# Patient Record
Sex: Male | Born: 1985
Health system: Southern US, Community
[De-identification: ages and names within clinical notes are randomized; demographics above are authoritative.]

## PROBLEM LIST (undated history)

## (undated) DIAGNOSIS — J45909 Unspecified asthma, uncomplicated: Secondary | ICD-10-CM

## (undated) HISTORY — PX: OPEN REDUCTION INTERNAL FIXATION (ORIF) HAND: SHX5991

---
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
## 2005-03-25 ENCOUNTER — Ambulatory Visit: Payer: Self-pay | Admitting: Family Medicine

## 2005-05-24 ENCOUNTER — Ambulatory Visit: Payer: Self-pay | Admitting: Family Medicine

## 2005-07-13 ENCOUNTER — Emergency Department (HOSPITAL_COMMUNITY): Admission: EM | Admit: 2005-07-13 | Discharge: 2005-07-13 | Payer: Self-pay | Admitting: *Deleted

## 2005-09-16 ENCOUNTER — Ambulatory Visit: Payer: Self-pay | Admitting: Family Medicine

## 2006-01-01 ENCOUNTER — Emergency Department (HOSPITAL_COMMUNITY): Admission: EM | Admit: 2006-01-01 | Discharge: 2006-01-01 | Payer: Self-pay | Admitting: Family Medicine

## 2006-08-12 ENCOUNTER — Ambulatory Visit: Payer: Self-pay | Admitting: Family Medicine

## 2007-01-05 ENCOUNTER — Ambulatory Visit: Payer: Self-pay | Admitting: Family Medicine

## 2007-01-08 DIAGNOSIS — J45909 Unspecified asthma, uncomplicated: Secondary | ICD-10-CM

## 2007-01-08 DIAGNOSIS — J309 Allergic rhinitis, unspecified: Secondary | ICD-10-CM | POA: Insufficient documentation

## 2007-05-29 ENCOUNTER — Emergency Department (HOSPITAL_COMMUNITY): Admission: EM | Admit: 2007-05-29 | Discharge: 2007-05-29 | Payer: Self-pay | Admitting: Emergency Medicine

## 2011-02-07 ENCOUNTER — Emergency Department (HOSPITAL_COMMUNITY)
Admission: EM | Admit: 2011-02-07 | Discharge: 2011-02-08 | Disposition: A | Discharge status: Home / Self Care | Payer: Federal, State, Local not specified - PPO | Attending: Emergency Medicine | Admitting: Emergency Medicine

## 2011-02-07 DIAGNOSIS — J45909 Unspecified asthma, uncomplicated: Secondary | ICD-10-CM | POA: Insufficient documentation

## 2011-02-07 DIAGNOSIS — R0789 Other chest pain: Secondary | ICD-10-CM | POA: Insufficient documentation

## 2011-02-07 DIAGNOSIS — R059 Cough, unspecified: Secondary | ICD-10-CM | POA: Insufficient documentation

## 2011-02-07 DIAGNOSIS — R05 Cough: Secondary | ICD-10-CM | POA: Insufficient documentation

## 2011-07-04 ENCOUNTER — Emergency Department: Payer: Self-pay | Admitting: Emergency Medicine

## 2012-05-10 ENCOUNTER — Emergency Department: Payer: Self-pay | Admitting: *Deleted

## 2012-07-22 ENCOUNTER — Ambulatory Visit: Payer: Self-pay | Admitting: Family Medicine

## 2015-12-28 ENCOUNTER — Other Ambulatory Visit: Payer: Self-pay | Admitting: Internal Medicine

## 2016-01-08 ENCOUNTER — Emergency Department
Admission: EM | Admit: 2016-01-08 | Discharge: 2016-01-08 | Disposition: A | Discharge status: Home / Self Care | Payer: Federal, State, Local not specified - PPO | Attending: Emergency Medicine | Admitting: Emergency Medicine

## 2016-01-08 ENCOUNTER — Encounter: Payer: Self-pay | Admitting: *Deleted

## 2016-01-08 DIAGNOSIS — Z7951 Long term (current) use of inhaled steroids: Secondary | ICD-10-CM | POA: Diagnosis not present

## 2016-01-08 DIAGNOSIS — J45909 Unspecified asthma, uncomplicated: Secondary | ICD-10-CM | POA: Diagnosis not present

## 2016-01-08 DIAGNOSIS — J069 Acute upper respiratory infection, unspecified: Secondary | ICD-10-CM | POA: Diagnosis not present

## 2016-01-08 DIAGNOSIS — F17200 Nicotine dependence, unspecified, uncomplicated: Secondary | ICD-10-CM | POA: Insufficient documentation

## 2016-01-08 DIAGNOSIS — R0981 Nasal congestion: Secondary | ICD-10-CM | POA: Diagnosis present

## 2016-01-08 MED ORDER — PSEUDOEPHEDRINE HCL 60 MG PO TABS: 60.0000 mg | ORAL_TABLET | ORAL | Status: DC | PRN

## 2016-01-08 MED ORDER — CHLORPHENIRAMINE MALEATE 4 MG PO TABS: 4.0000 mg | ORAL_TABLET | Freq: Two times a day (BID) | ORAL | Status: DC | PRN

## 2016-01-08 NOTE — ED Provider Notes (Signed)
[facility] Emergency Department Provider Note  ____________________________________________  Time seen: Approximately 9:16 PM  I have reviewed the triage vital signs and the nursing notes.   HISTORY  Chief Complaint Nasal Congestion    HPI Cody Murray. is a 30 y.o. male presents for evaluation of a 2- 3 day history of nasal congestion denies any cough denies any fever chills or sore throat. Has been taking over-the-counter medications without relief. Nothing seems to make it better or worse.   No past medical history on file.  Patient Active Problem List   Diagnosis Date Noted  . RHINITIS, ALLERGIC 01/08/2007  . ASTHMA, UNSPECIFIED 01/08/2007    No past surgical history on file.  Current Outpatient Rx  Name  Route  Sig  Dispense  Refill  . ADVAIR DISKUS 250-50 MCG/DOSE AEPB      INHALE 1 DOSE BY MOUTH TWICE DAILY. RINSE MOUTH AFTER USE   60 each   0   . chlorpheniramine (CHLOR-TRIMETON) 4 MG tablet   Oral   Take 1 tablet (4 mg total) by mouth 2 (two) times daily as needed for allergies or rhinitis.   30 tablet   0   . pseudoephedrine (SUDAFED) 60 MG tablet   Oral   Take 1 tablet (60 mg total) by mouth every 4 (four) hours as needed for congestion.   24 tablet   0   . VENTOLIN HFA 108 (90 Base) MCG/ACT inhaler      INHALE 2 PUFFS 4 TIMES DAILY AS NEEDED   18 Inhaler   0     Allergies Review of patient's allergies indicates no known allergies.  No family history on file.  Social History Social History  Substance Use Topics  . Smoking status: Current Some Day Smoker  . Smokeless tobacco: Not on file  . Alcohol Use: Yes    Review of Systems Constitutional: No fever/chills Eyes: No visual changes. ENT: No sore throat. Positive runny nose and nasal congestion. Cardiovascular: Denies chest pain. Respiratory: Denies shortness of breath. Denies any cough Gastrointestinal: No abdominal pain.  No nausea, no vomiting.   No diarrhea.  No constipation. Genitourinary: Negative for dysuria. Musculoskeletal: Negative for back pain. Skin: Negative for rash. Neurological: Negative for headaches, focal weakness or numbness.  10-point ROS otherwise negative.  ____________________________________________   PHYSICAL EXAM:  VITAL SIGNS: ED Triage Vitals  Enc Vitals Group     BP 01/08/16 2012 133/80 mmHg     Pulse Rate 01/08/16 2012 77     Resp 01/08/16 2012 20     Temp 01/08/16 2012 97.7 F (36.5 C)     Temp Source 01/08/16 2012 Oral     SpO2 01/08/16 2012 99 %     Weight 01/08/16 2012 208 lb (94.348 kg)     Height 01/08/16 2012 6' (1.829 m)     Head Cir --      Peak Flow --      Pain Score --      Pain Loc --      Pain Edu? --      Excl. in GC? --     Constitutional: Alert and oriented. Well appearing and in no acute distress. Head: Atraumatic. Nose: Positive congestion/rhinnorhea, turbinates swollen bilaterally. Mouth/Throat: Mucous membranes are moist.  Oropharynx non-erythematous. Neck: No stridor.  No cervical adenopathy. Cardiovascular: Normal rate, regular rhythm. Grossly normal heart sounds.  Good peripheral circulation. Respiratory: Normal respiratory effort.  No retractions. Lungs CTAB. Skin:  Skin is warm,  dry and intact. No rash noted. Psychiatric: Mood and affect are normal. Speech and behavior are normal.  ____________________________________________   LABS (all labs ordered are listed, but only abnormal results are displayed)  Labs Reviewed - No data to display ____________________________________________    PROCEDURES  Procedure(s) performed: None  Critical Care performed: No  ____________________________________________   INITIAL IMPRESSION / ASSESSMENT AND PLAN / ED COURSE  Pertinent labs & imaging results that were available during my care of the patient were reviewed by me and considered in my medical decision making (see chart for details).  Acute viral  URI. Rx given for Sudafed and chlorpheniramine. Patient to follow up with PCP or return to the ER with any worsening symptomology. Work excuse 24 hours given patient to follow up with PCP. ____________________________________________   FINAL CLINICAL IMPRESSION(S) / ED DIAGNOSES  Final diagnoses:  URI, acute     This chart was dictated using voice recognition software/Dragon. Despite best efforts to proofread, errors can occur which can change the meaning. Any change was purely unintentional.   Evangeline Dakin, PA-C 01/08/16 2122  Emily Filbert, MD 01/08/16 2219

## 2016-01-08 NOTE — ED Notes (Signed)
Pt reports nasal congestion for 2-3 days.  Taking otc meds without relief.

## 2016-01-08 NOTE — Discharge Instructions (Signed)
Upper Respiratory Infection, Adult Most upper respiratory infections (URIs) are a viral infection of the air passages leading to the lungs. A URI affects the nose, throat, and upper air passages. The most common type of URI is nasopharyngitis and is typically referred to as "the common cold." URIs run their course and usually go away on their own. Most of the time, a URI does not require medical attention, but sometimes a bacterial infection in the upper airways can follow a viral infection. This is called a secondary infection. Sinus and middle ear infections are common types of secondary upper respiratory infections. Bacterial pneumonia can also complicate a URI. A URI can worsen asthma and chronic obstructive pulmonary disease (COPD). Sometimes, these complications can require emergency medical care and may be life threatening.  CAUSES Almost all URIs are caused by viruses. A virus is a type of germ and can spread from one person to another.  RISKS FACTORS You may be at risk for a URI if:   You smoke.   You have chronic heart or lung disease.  You have a weakened defense (immune) system.   You are very young or very old.   You have nasal allergies or asthma.  You work in crowded or poorly ventilated areas.  You work in health care facilities or schools. SIGNS AND SYMPTOMS  Symptoms typically develop 2-3 days after you come in contact with a cold virus. Most viral URIs last 7-10 days. However, viral URIs from the influenza virus (flu virus) can last 14-18 days and are typically more severe. Symptoms may include:   Runny or stuffy (congested) nose.   Sneezing.   Cough.   Sore throat.   Headache.   Fatigue.   Fever.   Loss of appetite.   Pain in your forehead, behind your eyes, and over your cheekbones (sinus pain).  Muscle aches.  DIAGNOSIS  Your health care provider may diagnose a URI by:  Physical exam.  Tests to check that your symptoms are not due to  another condition such as:  Strep throat.  Sinusitis.  Pneumonia.  Asthma. TREATMENT  A URI goes away on its own with time. It cannot be cured with medicines, but medicines may be prescribed or recommended to relieve symptoms. Medicines may help:  Reduce your fever.  Reduce your cough.  Relieve nasal congestion. HOME CARE INSTRUCTIONS   Take medicines only as directed by your health care provider.   Gargle warm saltwater or take cough drops to comfort your throat as directed by your health care provider.  Use a warm mist humidifier or inhale steam from a shower to increase air moisture. This may make it easier to breathe.  Drink enough fluid to keep your urine clear or pale yellow.   Eat soups and other clear broths and maintain good nutrition.   Rest as needed.   Return to work when your temperature has returned to normal or as your health care provider advises. You may need to stay home longer to avoid infecting others. You can also use a face mask and careful hand washing to prevent spread of the virus.  Increase the usage of your inhaler if you have asthma.   Do not use any tobacco products, including cigarettes, chewing tobacco, or electronic cigarettes. If you need help quitting, ask your health care provider. PREVENTION  The best way to protect yourself from getting a cold is to practice good hygiene.   Avoid oral or hand contact with people with cold   symptoms.   Wash your hands often if contact occurs.  There is no clear evidence that vitamin C, vitamin E, echinacea, or exercise reduces the chance of developing a cold. However, it is always recommended to get plenty of rest, exercise, and practice good nutrition.  SEEK MEDICAL CARE IF:   You are getting worse rather than better.   Your symptoms are not controlled by medicine.   You have chills.  You have worsening shortness of breath.  You have brown or red mucus.  You have yellow or brown nasal  discharge.  You have pain in your face, especially when you bend forward.  You have a fever.  You have swollen neck glands.  You have pain while swallowing.  You have white areas in the back of your throat. SEEK IMMEDIATE MEDICAL CARE IF:   You have severe or persistent:  Headache.  Ear pain.  Sinus pain.  Chest pain.  You have chronic lung disease and any of the following:  Wheezing.  Prolonged cough.  Coughing up blood.  A change in your usual mucus.  You have a stiff neck.  You have changes in your:  Vision.  Hearing.  Thinking.  Mood. MAKE SURE YOU:   Understand these instructions.  Will watch your condition.  Will get help right away if you are not doing well or get worse.   This information is not intended to replace advice given to you by your health care provider. Make sure you discuss any questions you have with your health care provider.   Document Released: 04/23/2001 Document Revised: 03/14/2015 Document Reviewed: 02/02/2014 Elsevier Interactive Patient Education 2016 [employer]vier Inc.  

## 2016-01-31 ENCOUNTER — Other Ambulatory Visit: Payer: Self-pay | Admitting: Internal Medicine

## 2016-02-08 NOTE — Telephone Encounter (Signed)
Lm messages for pt, and no return calls.

## 2016-04-01 ENCOUNTER — Encounter (HOSPITAL_COMMUNITY): Payer: Self-pay | Admitting: Emergency Medicine

## 2016-04-01 DIAGNOSIS — J45901 Unspecified asthma with (acute) exacerbation: Secondary | ICD-10-CM | POA: Insufficient documentation

## 2016-04-01 DIAGNOSIS — R062 Wheezing: Secondary | ICD-10-CM | POA: Diagnosis present

## 2016-04-01 MED ORDER — ALBUTEROL SULFATE (2.5 MG/3ML) 0.083% IN NEBU
5.0000 mg | INHALATION_SOLUTION | Freq: Once | RESPIRATORY_TRACT | Status: AC
  Administered 2016-04-01: 5 mg via RESPIRATORY_TRACT

## 2016-04-01 MED ORDER — ALBUTEROL SULFATE (2.5 MG/3ML) 0.083% IN NEBU
INHALATION_SOLUTION | RESPIRATORY_TRACT | Status: AC
  Filled 2016-04-01: qty 6

## 2016-04-01 NOTE — ED Notes (Signed)
Patient arrives with asthma exacerbation. States onset 2 days ago while he was in [place]. Endorses history of asthma which requires him to use inhalers daily. Recently has been out of his medications. Wheezing both ways and severely diminished in triage.

## 2016-04-02 ENCOUNTER — Other Ambulatory Visit: Payer: Self-pay | Admitting: Internal Medicine

## 2016-04-02 ENCOUNTER — Emergency Department (HOSPITAL_COMMUNITY)
Admission: EM | Admit: 2016-04-02 | Discharge: 2016-04-02 | Disposition: A | Discharge status: Home / Self Care | Payer: Federal, State, Local not specified - PPO | Attending: Emergency Medicine | Admitting: Emergency Medicine

## 2016-04-02 ENCOUNTER — Telehealth: Payer: Self-pay

## 2016-04-02 HISTORY — DX: Unspecified asthma, uncomplicated: J45.909

## 2016-04-02 NOTE — Telephone Encounter (Signed)
He has not been seen since 2014.  He needs to be seen for prescription refills.

## 2016-04-02 NOTE — Telephone Encounter (Signed)
Left message that he has been in ER twice and need Ventolin and Advair but I do not see where you have seen him.

## 2016-04-03 ENCOUNTER — Encounter: Payer: Self-pay | Admitting: Internal Medicine

## 2016-04-03 ENCOUNTER — Ambulatory Visit (INDEPENDENT_AMBULATORY_CARE_PROVIDER_SITE_OTHER): Payer: Federal, State, Local not specified - PPO | Admitting: Internal Medicine

## 2016-04-03 VITALS — BP 120/90 | HR 82 | Ht 72.0 in | Wt 190.0 lb

## 2016-04-03 DIAGNOSIS — J4520 Mild intermittent asthma, uncomplicated: Secondary | ICD-10-CM | POA: Diagnosis not present

## 2016-04-03 MED ORDER — ALBUTEROL SULFATE HFA 108 (90 BASE) MCG/ACT IN AERS: 2.0000 | INHALATION_SPRAY | Freq: Four times a day (QID) | RESPIRATORY_TRACT | Status: DC | PRN

## 2016-04-03 MED ORDER — FLUTICASONE-SALMETEROL 250-50 MCG/DOSE IN AEPB: 1.0000 | INHALATION_SPRAY | Freq: Two times a day (BID) | RESPIRATORY_TRACT | Status: DC

## 2016-04-03 NOTE — Progress Notes (Signed)
Date:  04/03/2016   Name:  Cody Murray.   DOB:  August 26, 1986   MRN:  [id number]  Patient has not been seen in 2.5 years.  He has been getting medications from urgent care.  Chief Complaint: Asthma Asthma He complains of wheezing. There is no cough or shortness of breath. The problem occurs intermittently. The problem has been waxing and waning. Pertinent negatives include no chest pain, ear pain, fever, headaches, myalgias, postnasal drip, sore throat or trouble swallowing. His symptoms are aggravated by occupational exposure (now working in an area with more dust). His symptoms are alleviated by beta-agonist and steroid inhaler. He reports complete improvement on treatment. His past medical history is significant for asthma.      Review of Systems  Constitutional: Negative for fever, fatigue and unexpected weight change.  HENT: Negative for ear pain, hearing loss, postnasal drip, sore throat and trouble swallowing.   Eyes: Negative for visual disturbance.  Respiratory: Positive for wheezing. Negative for cough, chest tightness and shortness of breath.   Cardiovascular: Negative for chest pain and palpitations.  Musculoskeletal: Negative for myalgias.  Neurological: Negative for dizziness and headaches.  Psychiatric/Behavioral: Negative for sleep disturbance.    Patient Active Problem List   Diagnosis Date Noted  . RHINITIS, ALLERGIC 01/08/2007  . ASTHMA, UNSPECIFIED 01/08/2007    Prior to Admission medications   Medication Sig Start Date End Date Taking? Authorizing Provider  ADVAIR DISKUS 250-50 MCG/DOSE AEPB INHALE 1 DOSE BY MOUTH TWICE DAILY. RINSE MOUTH AFTER USE 12/28/15  Yes Reubin Milan, MD  chlorpheniramine (CHLOR-TRIMETON) 4 MG tablet Take 1 tablet (4 mg total) by mouth 2 (two) times daily as needed for allergies or rhinitis. 01/08/16  Yes Charmayne Sheer Beers, PA-C  pseudoephedrine (SUDAFED) 60 MG tablet Take 1 tablet (60 mg total) by mouth every 4 (four) hours as  needed for congestion. 01/08/16  Yes Charles M Beers, PA-C  VENTOLIN HFA 108 (90 Base) MCG/ACT inhaler INHALE 2 PUFFS 4 TIMES DAILY AS NEEDED 12/28/15  Yes Reubin Milan, MD    No Known Allergies  History reviewed. No pertinent past surgical history.  Social History  Substance Use Topics  . Smoking status: Former Games developer  . Smokeless tobacco: None  . Alcohol Use: Yes     Medication list has been reviewed and updated.   Physical Exam  Constitutional: He is oriented to person, place, and time. He appears well-developed. No distress.  HENT:  Head: Normocephalic and atraumatic.  Neck: Normal range of motion. Neck supple. No thyromegaly present.  Cardiovascular: Normal rate, regular rhythm and normal heart sounds.   Pulmonary/Chest: Effort normal. No accessory muscle usage. No respiratory distress. He has wheezes in the right lower field.  Musculoskeletal: He exhibits no edema or tenderness.  Lymphadenopathy:    He has no cervical adenopathy.  Neurological: He is alert and oriented to person, place, and time.  Skin: Skin is warm and dry. No rash noted.  Psychiatric: He has a normal mood and affect. His behavior is normal. Thought content normal.  Nursing note and vitals reviewed.   BP 120/90 mmHg  Pulse 82  Ht 6' (1.829 m)  Wt 190 lb (86.183 kg)  BMI 25.76 kg/m2  Assessment and Plan: 1. Asthma, mild intermittent, uncomplicated Continue advair bid and albuterol MDI prn - Fluticasone-Salmeterol (ADVAIR DISKUS) 250-50 MCG/DOSE AEPB; Inhale 1 puff into the lungs 2 (two) times daily.  Dispense: 60 each; Refill: 12 - albuterol (VENTOLIN HFA) 108 (90 Base) MCG/ACT  inhaler; Inhale 2 puffs into the lungs every 6 (six) hours as needed for wheezing or shortness of breath.  Dispense: 18 Inhaler; Refill: 12   Bari EdwardLaura Ranessa Kosta, MD [facility]  04/03/2016

## 2016-06-04 ENCOUNTER — Ambulatory Visit (INDEPENDENT_AMBULATORY_CARE_PROVIDER_SITE_OTHER): Payer: Federal, State, Local not specified - PPO | Admitting: Internal Medicine

## 2016-06-04 ENCOUNTER — Encounter (INDEPENDENT_AMBULATORY_CARE_PROVIDER_SITE_OTHER): Payer: Self-pay

## 2016-06-04 ENCOUNTER — Encounter: Payer: Self-pay | Admitting: Internal Medicine

## 2016-06-04 VITALS — BP 128/86 | HR 80 | Temp 98.8°F | Resp 16 | Ht 72.0 in | Wt 196.0 lb

## 2016-06-04 DIAGNOSIS — J4520 Mild intermittent asthma, uncomplicated: Secondary | ICD-10-CM | POA: Diagnosis not present

## 2016-06-04 DIAGNOSIS — B353 Tinea pedis: Secondary | ICD-10-CM | POA: Diagnosis not present

## 2016-06-04 MED ORDER — NYSTATIN 100000 UNIT/GM EX CREA: 1.0000 "application " | TOPICAL_CREAM | Freq: Two times a day (BID) | CUTANEOUS | 3 refills | Status: DC

## 2016-06-04 MED ORDER — FLUCONAZOLE 100 MG PO TABS: 100.0000 mg | ORAL_TABLET | Freq: Every day | ORAL | 0 refills | Status: DC

## 2016-06-04 NOTE — Patient Instructions (Signed)

## 2016-06-04 NOTE — Progress Notes (Signed)
    Date:  06/04/2016   Name:  Cody Murray.   DOB:  04-22-1986   MRN:  [id number]   Chief Complaint: Nail Problem (Right foot, 5th digit fungus like infection x 2-3 months denies any injury or athletes feet in past but does wear [employer] when working long hours and states his feet sweat often. )   Review of Systems  Respiratory: Negative for shortness of breath and wheezing.   Cardiovascular: Negative for chest pain.  Skin: Positive for color change and rash.    Patient Active Problem List   Diagnosis Date Noted  . RHINITIS, ALLERGIC 01/08/2007  . Asthma 01/08/2007    Prior to Admission medications   Medication Sig Start Date End Date Taking? Authorizing Provider  albuterol (VENTOLIN HFA) 108 (90 Base) MCG/ACT inhaler Inhale 2 puffs into the lungs every 6 (six) hours as needed for wheezing or shortness of breath. 04/03/16  Yes Reubin Milan, MD  Fluticasone-Salmeterol (ADVAIR DISKUS) 250-50 MCG/DOSE AEPB Inhale 1 puff into the lungs 2 (two) times daily. 04/03/16  Yes Reubin Milan, MD    No Known Allergies  History reviewed. No pertinent surgical history.  Social History  Substance Use Topics  . Smoking status: Former Games developer  . Smokeless tobacco: Not on file  . Alcohol use Yes     Medication list has been reviewed and updated.   Physical Exam  Constitutional: He is oriented to person, place, and time. He appears well-developed. No distress.  HENT:  Head: Normocephalic and atraumatic.  Cardiovascular: Normal rate, regular rhythm and intact distal pulses.   Pulmonary/Chest: Effort normal. No respiratory distress.  Musculoskeletal: Normal range of motion.  Neurological: He is alert and oriented to person, place, and time.  Skin: Skin is warm and dry. No rash noted.  Maceration between 4th and 5th toes on right foot.  Scaling of sole of foot c/w tinea.  Psychiatric: He has a normal mood and affect. His behavior is normal. Thought content normal.  Nursing note  and vitals reviewed.   BP 128/86 (BP Location: Right Arm, Patient Position: Sitting, Cuff Size: Normal)   Pulse 80   Temp 98.8 F (37.1 C) (Oral)   Resp 16   Ht 6' (1.829 m)   Wt 196 lb (88.9 kg)   SpO2 97%   BMI 26.58 kg/m   Assessment and Plan: 1. Tinea pedis of right foot Aggressive foot care discussed - fluconazole (DIFLUCAN) 100 MG tablet; Take 1 tablet (100 mg total) by mouth daily.  Dispense: 7 tablet; Refill: 0 - nystatin cream (MYCOSTATIN); Apply 1 application topically 2 (two) times daily.  Dispense: 30 g; Refill: 3  2. Asthma, mild intermittent, uncomplicated Improved - continue Advair   Bari Edward, MD [facility]  06/04/2016

## 2016-10-15 ENCOUNTER — Encounter: Payer: Self-pay | Admitting: Internal Medicine

## 2016-10-15 ENCOUNTER — Ambulatory Visit (INDEPENDENT_AMBULATORY_CARE_PROVIDER_SITE_OTHER): Payer: Federal, State, Local not specified - PPO | Admitting: Internal Medicine

## 2016-10-15 VITALS — BP 132/84 | HR 88 | Temp 98.8°F | Resp 16 | Ht 72.0 in | Wt 199.0 lb

## 2016-10-15 DIAGNOSIS — L853 Xerosis cutis: Secondary | ICD-10-CM | POA: Diagnosis not present

## 2016-10-15 DIAGNOSIS — J029 Acute pharyngitis, unspecified: Secondary | ICD-10-CM | POA: Diagnosis not present

## 2016-10-15 MED ORDER — AMOXICILLIN 500 MG PO CAPS: 500.0000 mg | ORAL_CAPSULE | Freq: Three times a day (TID) | ORAL | 0 refills | Status: DC

## 2016-10-15 NOTE — Patient Instructions (Addendum)
Ibuprofen 200 mg tablets - take 3 tablets three times a day until throat improved.

## 2016-10-15 NOTE — Progress Notes (Signed)
Date:  10/15/2016   Name:  Cody Caraslbert Puccinelli Jr.   DOB:  [birth year]   MRN:  [id number]   Chief Complaint: Sore Throat (swollen for few days can not eat for 2 days ) and Rash (dry skin patches) Sore Throat   This is a new problem. The current episode started in the past 7 days. The problem has been unchanged. Neither side of throat is experiencing more pain than the other. There has been no fever. Associated symptoms include ear pain and trouble swallowing. Pertinent negatives include no abdominal pain, coughing, diarrhea or vomiting.    Review of Systems  Constitutional: Positive for fatigue. Negative for chills and fever.  HENT: Positive for ear pain, sore throat and trouble swallowing.   Eyes: Negative for visual disturbance.  Respiratory: Negative for cough, chest tightness and wheezing.   Cardiovascular: Negative for chest pain.  Gastrointestinal: Negative for abdominal pain, diarrhea and vomiting.  Genitourinary: Negative for dysuria.  Skin:       Dry skin on arms and legs    Patient Active Problem List   Diagnosis Date Noted  . Tinea pedis of right foot 06/04/2016  . RHINITIS, ALLERGIC 01/08/2007  . Asthma 01/08/2007    Prior to Admission medications   Medication Sig Start Date End Date Taking? Authorizing Provider  albuterol (VENTOLIN HFA) 108 (90 Base) MCG/ACT inhaler Inhale 2 puffs into the lungs every 6 (six) hours as needed for wheezing or shortness of breath. 04/03/16  Yes Reubin MilanLaura H Sitara Cashwell, MD  Fluticasone-Salmeterol (ADVAIR DISKUS) 250-50 MCG/DOSE AEPB Inhale 1 puff into the lungs 2 (two) times daily. 04/03/16  Yes Reubin MilanLaura H Vang Kraeger, MD    No Known Allergies  History reviewed. No pertinent surgical history.  Social History  Substance Use Topics  . Smoking status: Former Games developermoker  . Smokeless tobacco: Never Used  . Alcohol use Yes     Medication list has been reviewed and updated.   Physical Exam  Constitutional: He is oriented to person, place, and time.  He appears well-developed. No distress.  HENT:  Head: Normocephalic and atraumatic.  Right Ear: Ear canal normal. Tympanic membrane is erythematous and retracted.  Left Ear: Ear canal normal. Tympanic membrane is erythematous and retracted.  Nose: Right sinus exhibits maxillary sinus tenderness. Left sinus exhibits maxillary sinus tenderness.  Mouth/Throat: Posterior oropharyngeal edema and posterior oropharyngeal erythema present. No oropharyngeal exudate or tonsillar abscesses.  Cardiovascular: Normal rate, regular rhythm and normal heart sounds.   Pulmonary/Chest: Effort normal and breath sounds normal. No respiratory distress.  Musculoskeletal: Normal range of motion.  Neurological: He is alert and oriented to person, place, and time.  Skin: Skin is warm and dry. No rash noted.  Dry flaking skin patches on arms - unremarkable  Psychiatric: He has a normal mood and affect. His behavior is normal. Thought content normal.  Nursing note and vitals reviewed.   BP 132/84   Pulse 88   Temp 98.8 F (37.1 C)   Resp 16   Ht 6' (1.829 m)   Wt 199 lb (90.3 kg)   SpO2 98%   BMI 26.99 kg/m   Assessment and Plan: 1. Pharyngitis, unspecified etiology Take advil 600 mg tid Warm liquids Note to be out of work until 10/20/16 - amoxicillin (AMOXIL) 500 MG capsule; Take 1 capsule (500 mg total) by mouth 3 (three) times daily.  Dispense: 30 capsule; Refill: 0  2. Xerosis of skin Use moisturizing lotion PRN   Bari EdwardLaura Nichele Slawson, MD [facility]  [facility]  10/15/2016

## 2017-02-18 ENCOUNTER — Other Ambulatory Visit: Payer: Self-pay

## 2017-02-19 MED ORDER — ALBUTEROL SULFATE HFA 108 (90 BASE) MCG/ACT IN AERS: 2.0000 | INHALATION_SPRAY | Freq: Four times a day (QID) | RESPIRATORY_TRACT | 5 refills | Status: DC | PRN

## 2017-02-26 ENCOUNTER — Other Ambulatory Visit: Payer: Self-pay | Admitting: Internal Medicine

## 2017-02-26 MED ORDER — FLUTICASONE-SALMETEROL 250-50 MCG/DOSE IN AEPB: 1.0000 | INHALATION_SPRAY | Freq: Two times a day (BID) | RESPIRATORY_TRACT | 1 refills | Status: DC

## 2017-04-29 DIAGNOSIS — R1013 Epigastric pain: Secondary | ICD-10-CM | POA: Diagnosis not present

## 2017-04-29 DIAGNOSIS — R51 Headache: Secondary | ICD-10-CM | POA: Diagnosis not present

## 2017-04-29 DIAGNOSIS — F17200 Nicotine dependence, unspecified, uncomplicated: Secondary | ICD-10-CM | POA: Diagnosis not present

## 2017-08-01 ENCOUNTER — Encounter (HOSPITAL_COMMUNITY): Payer: Self-pay | Admitting: *Deleted

## 2017-08-01 ENCOUNTER — Ambulatory Visit (HOSPITAL_COMMUNITY)
Admission: EM | Admit: 2017-08-01 | Discharge: 2017-08-01 | Disposition: A | Discharge status: Home / Self Care | Payer: Federal, State, Local not specified - PPO | Attending: Internal Medicine | Admitting: Internal Medicine

## 2017-08-01 DIAGNOSIS — R0602 Shortness of breath: Secondary | ICD-10-CM | POA: Diagnosis not present

## 2017-08-01 DIAGNOSIS — J4541 Moderate persistent asthma with (acute) exacerbation: Secondary | ICD-10-CM | POA: Diagnosis not present

## 2017-08-01 DIAGNOSIS — R062 Wheezing: Secondary | ICD-10-CM | POA: Diagnosis not present

## 2017-08-01 MED ORDER — FLUTICASONE-SALMETEROL 250-50 MCG/DOSE IN AEPB: 1.0000 | INHALATION_SPRAY | Freq: Two times a day (BID) | RESPIRATORY_TRACT | 0 refills | Status: DC

## 2017-08-01 MED ORDER — IPRATROPIUM-ALBUTEROL 0.5-2.5 (3) MG/3ML IN SOLN
RESPIRATORY_TRACT | Status: AC
Start: 2017-08-01 — End: 2017-08-01
  Filled 2017-08-01: qty 3

## 2017-08-01 MED ORDER — IPRATROPIUM-ALBUTEROL 0.5-2.5 (3) MG/3ML IN SOLN
3.0000 mL | Freq: Once | RESPIRATORY_TRACT | Status: AC
  Administered 2017-08-01: 3 mL via RESPIRATORY_TRACT

## 2017-08-01 MED ORDER — ALBUTEROL SULFATE HFA 108 (90 BASE) MCG/ACT IN AERS: 2.0000 | INHALATION_SPRAY | Freq: Four times a day (QID) | RESPIRATORY_TRACT | 0 refills | Status: DC | PRN

## 2017-08-01 NOTE — ED Triage Notes (Signed)
Pt  Reports    Shortness  Of   Allergies     Cough   Sneezing      Wheezing  Ran out of  His  Inhaler

## 2017-08-01 NOTE — ED Provider Notes (Signed)
[facility]    CSN: [id number] Arrival date & time: 08/01/17  1356     History   Chief Complaint Chief Complaint  Patient presents with  . Shortness of Breath    HPI Cody Mani. is a 31 y.o. male.   31 year old male with history of asthma comes in for 2 day history of wheezing, shortness of breath, cough, congestion. Patient states symptoms started after he accidentally inhaled smoke while restoring his car. States he ran out of his medications and need refills, PCP did not pick up his phone call. Denies fever, chills, night sweats. Denies ear pain, eye pan, sore throat. Cough worse at night. He states he took some tylenol, which helped with his symptoms. Denies sick contact.       Past Medical History:  Diagnosis Date  . Asthma     Patient Active Problem List   Diagnosis Date Noted  . Xerosis of skin 10/15/2016  . Tinea pedis of right foot 06/04/2016  . RHINITIS, ALLERGIC 01/08/2007  . Asthma 01/08/2007    History reviewed. No pertinent surgical history.     Home Medications    Prior to Admission medications   Medication Sig Start Date End Date Taking? Authorizing Provider  albuterol (VENTOLIN HFA) 108 (90 Base) MCG/ACT inhaler Inhale 2 puffs into the lungs every 6 (six) hours as needed for wheezing or shortness of breath. 08/01/17   Cathie Hoops, Kyrese Gartman V, PA-C  amoxicillin (AMOXIL) 500 MG capsule Take 1 capsule (500 mg total) by mouth 3 (three) times daily. 10/15/16   Reubin Milan, MD  Fluticasone-Salmeterol (ADVAIR DISKUS) 250-50 MCG/DOSE AEPB Inhale 1 puff into the lungs 2 (two) times daily. 08/01/17   Belinda Fisher, PA-C    Family History History reviewed. No pertinent family history.  Social History Social History  Substance Use Topics  . Smoking status: Former Games developer  . Smokeless tobacco: Never Used  . Alcohol use Yes     Allergies   Patient has no known allergies.   Review of Systems Review of Systems  Reason unable to perform ROS:  See HPI as above.     Physical Exam Triage Vital Signs ED Triage Vitals [08/01/17 1612]  Enc Vitals Group     BP 125/72     Pulse Rate 89     Resp (!) 24     Temp 98.5 F (36.9 C)     Temp Source Oral     SpO2 100 %     Weight      Height      Head Circumference      Peak Flow      Pain Score      Pain Loc      Pain Edu?      Excl. in GC?    No data found.   Updated Vital Signs BP 125/72   Pulse 89   Temp 98.5 F (36.9 C) (Oral)   Resp (!) 24   SpO2 100%     Physical Exam  Constitutional: He is oriented to person, place, and time. He appears well-developed and well-nourished. No distress.  HENT:  Head: Normocephalic and atraumatic.  Right Ear: Tympanic membrane, external ear and ear canal normal. Tympanic membrane is not erythematous and not bulging.  Left Ear: Tympanic membrane, external ear and ear canal normal. Tympanic membrane is not erythematous and not bulging.  Nose: Rhinorrhea present. Right sinus exhibits no maxillary sinus tenderness and no frontal sinus tenderness. Left sinus exhibits  no maxillary sinus tenderness and no frontal sinus tenderness.  Mouth/Throat: Uvula is midline, oropharynx is clear and moist and mucous membranes are normal.  Eyes: Pupils are equal, round, and reactive to light. Conjunctivae are normal.  Neck: Normal range of motion. Neck supple.  Cardiovascular: Normal rate, regular rhythm and normal heart sounds.  Exam reveals no gallop and no friction rub.   No murmur heard. Pulmonary/Chest: Effort normal. He has no decreased breath sounds. He has wheezes (scattered inspiratory and expiratory wheezing). He has no rhonchi. He has no rales.  Lymphadenopathy:    He has no cervical adenopathy.  Neurological: He is alert and oriented to person, place, and time.  Skin: Skin is warm and dry.  Psychiatric: He has a normal mood and affect. His behavior is normal. Judgment normal.     UC Treatments / Results  Labs (all labs ordered are  listed, but only abnormal results are displayed) Labs Reviewed - No data to display  EKG  EKG Interpretation None       Radiology No results found.  Procedures Procedures (including critical care time)  Medications Ordered in UC Medications  ipratropium-albuterol (DUONEB) 0.5-2.5 (3) MG/3ML nebulizer solution 3 mL (3 mLs Nebulization Given 08/01/17 1644)     Initial Impression / Assessment and Plan / UC Course  I have reviewed the triage vital signs and the nursing notes.  Pertinent labs & imaging results that were available during my care of the patient were reviewed by me and considered in my medical decision making (see chart for details).    Lungs clear to auscultation bilaterally without adventitious lung sounds after DuoNeb. Refilled patient's albuterol and Advair. Return precautions given.   Final Clinical Impressions(s) / UC Diagnoses   Final diagnoses:  Moderate persistent asthma with exacerbation    New Prescriptions Current Discharge Medication List        Lurline Idol 08/01/17 1721

## 2017-08-01 NOTE — Discharge Instructions (Signed)
Refilled your albuterol and Advair. Follow-up with PCP for further management and treatment of asthma. If experiencing worsening of symptoms, shortness of breath, chest pain, wheezing, trouble breathing, trouble swallowing, follow-up for reevaluation.

## 2017-08-15 ENCOUNTER — Encounter: Payer: Self-pay | Admitting: Internal Medicine

## 2017-08-15 ENCOUNTER — Ambulatory Visit (INDEPENDENT_AMBULATORY_CARE_PROVIDER_SITE_OTHER): Payer: Federal, State, Local not specified - PPO | Admitting: Internal Medicine

## 2017-08-15 VITALS — BP 128/82 | HR 91 | Temp 98.5°F | Ht 72.0 in | Wt 200.0 lb

## 2017-08-15 DIAGNOSIS — N342 Other urethritis: Secondary | ICD-10-CM

## 2017-08-15 DIAGNOSIS — M79642 Pain in left hand: Secondary | ICD-10-CM | POA: Diagnosis not present

## 2017-08-15 DIAGNOSIS — Z23 Encounter for immunization: Secondary | ICD-10-CM

## 2017-08-15 DIAGNOSIS — J4520 Mild intermittent asthma, uncomplicated: Secondary | ICD-10-CM

## 2017-08-15 MED ORDER — AZITHROMYCIN 250 MG PO TABS: ORAL_TABLET | ORAL | 0 refills | Status: DC

## 2017-08-15 MED ORDER — FLUTICASONE-SALMETEROL 250-50 MCG/DOSE IN AEPB: 1.0000 | INHALATION_SPRAY | Freq: Two times a day (BID) | RESPIRATORY_TRACT | 0 refills | Status: DC

## 2017-08-15 MED ORDER — ALBUTEROL SULFATE HFA 108 (90 BASE) MCG/ACT IN AERS: 2.0000 | INHALATION_SPRAY | Freq: Four times a day (QID) | RESPIRATORY_TRACT | 0 refills | Status: DC | PRN

## 2017-08-15 NOTE — Progress Notes (Signed)
Date:  08/15/2017   Name:  Cody Murray.   DOB:  [birth year]   MRN:  [id number]   Chief Complaint: Hand Pain (Right Ringer finger pain. Hurt at work, unsure if broken. 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 Clear and sticky feeling. No burning when peeing. New sexual partner. and had Oral sex two days ago. )  Hand Pain   There was no injury mechanism. The pain is present in the left fingers. The quality of the pain is described as cramping. The pain does not radiate. The pain is moderate. The pain has been fluctuating since the incident. Pertinent negatives include no chest pain. The symptoms are aggravated by lifting and movement.  Penile Discharge  The patient's primary symptoms include penile discharge. The patient's pertinent negatives include no penile pain or testicular pain. This is a new problem. The current episode started yesterday. The problem has been unchanged. The patient is experiencing no pain. Pertinent negatives include no chest pain, dysuria, fever, rash or shortness of breath. The penile discharge was clear and thick. He is sexually active. He inconsistently uses condoms. It is unknown whether or not his partner has an STD.  Asthma  He complains of wheezing. There is no shortness of breath. This is a recurrent problem. The problem occurs intermittently. The problem has been waxing and waning. Pertinent negatives include no chest pain or fever. His symptoms are alleviated by beta-agonist. He reports significant improvement on treatment. His symptoms are not alleviated by steroid inhaler and beta-agonist. His past medical history is significant for asthma.      Review of Systems  Constitutional: Negative for diaphoresis, fatigue and fever.  Respiratory: Positive for wheezing. Negative for choking, chest tightness and shortness of breath.   Cardiovascular: Negative for chest pain and palpitations.  Genitourinary:  Positive for discharge. Negative for difficulty urinating, dysuria, genital sores, hematuria, penile pain and testicular pain.  Musculoskeletal: Positive for arthralgias (hands).  Skin: Negative for rash.  Psychiatric/Behavioral: Negative for dysphoric mood and sleep disturbance.    Patient Active Problem List   Diagnosis Date Noted  . Xerosis of skin 10/15/2016  . Tinea pedis of right foot 06/04/2016  . RHINITIS, ALLERGIC 01/08/2007  . Asthma 01/08/2007    Prior to Admission medications   Medication Sig Start Date End Date Taking? Authorizing Provider  albuterol (VENTOLIN HFA) 108 (90 Base) MCG/ACT inhaler Inhale 2 puffs into the lungs every 6 (six) hours as needed for wheezing or shortness of breath. 08/01/17  Yes Yu, Amy V, PA-C  Fluticasone-Salmeterol (ADVAIR DISKUS) 250-50 MCG/DOSE AEPB Inhale 1 puff into the lungs 2 (two) times daily. 08/01/17  Yes Yu, Amy V, PA-C    No Known Allergies  History reviewed. No pertinent surgical history.  Social History  Substance Use Topics  . Smoking status: Former Games developer  . Smokeless tobacco: Never Used  . Alcohol use Yes     Medication list has been reviewed and updated.  PHQ 2/9 Scores 08/15/2017 04/03/2016  PHQ - 2 Score 0 0    Physical Exam  Constitutional: He is oriented to person, place, and time. He appears well-developed. No distress.  HENT:  Head: Normocephalic and atraumatic.  Neck: Normal range of motion. Neck supple.  Cardiovascular: Normal rate, regular rhythm and normal heart sounds.   Pulmonary/Chest: Effort normal and breath sounds normal. No respiratory distress. He has no wheezes. He has no rales.  Genitourinary:  Genitourinary Comments: GU  exam deferred  Musculoskeletal: Normal range of motion.  Mobile firm nodule at base on ring finger, left, on palmar aspect  Neurological: He is alert and oriented to person, place, and time.  Skin: Skin is warm and dry. No rash noted.  Psychiatric: He has a normal mood and  affect. His behavior is normal. Thought content normal.  Nursing note and vitals reviewed.   BP 128/82   Pulse 91   Temp 98.5 F (36.9 C) (Oral)   Ht 6' (1.829 m)   Wt 200 lb (90.7 kg)   SpO2 97%   BMI 27.12 kg/m   Assessment and Plan: 1. Mild intermittent asthma without complication Continue current therapy - Fluticasone-Salmeterol (ADVAIR DISKUS) 250-50 MCG/DOSE AEPB; Inhale 1 puff into the lungs 2 (two) times daily.  Dispense: 60 each; Refill: 0 - albuterol (VENTOLIN HFA) 108 (90 Base) MCG/ACT inhaler; Inhale 2 puffs into the lungs every 6 (six) hours as needed for wheezing or shortness of breath.  Dispense: 18 g; Refill: 0  2. Urethritis Treat for non-gc for now, test urine Pt had negative HIV test 6 months ago - GC/Chlamydia Probe Amp - azithromycin (ZITHROMAX Z-PAK) 250 MG tablet; Take 500 mg once  Dispense: 2 each; Refill: 0  3. Hand pain, left - DG Hand Complete Left; Future  4. Need for influenza vaccination - Flu Vaccine QUAD 36+ mos IM   Meds ordered this encounter  Medications  . azithromycin (ZITHROMAX Z-PAK) 250 MG tablet    Sig: Take 500 mg once    Dispense:  2 each    Refill:  0  . Fluticasone-Salmeterol (ADVAIR DISKUS) 250-50 MCG/DOSE AEPB    Sig: Inhale 1 puff into the lungs 2 (two) times daily.    Dispense:  60 each    Refill:  0  . albuterol (VENTOLIN HFA) 108 (90 Base) MCG/ACT inhaler    Sig: Inhale 2 puffs into the lungs every 6 (six) hours as needed for wheezing or shortness of breath.    Dispense:  18 g    Refill:  0    Partially dictated using Animal nutritionist. Any errors are unintentional.  Bari Edward, MD [facility]  08/15/2017

## 2017-08-18 LAB — GC/CHLAMYDIA PROBE AMP
CHLAMYDIA, DNA PROBE: NEGATIVE
NEISSERIA GONORRHOEAE BY PCR: NEGATIVE

## 2017-08-22 ENCOUNTER — Emergency Department (HOSPITAL_COMMUNITY): Payer: Federal, State, Local not specified - PPO

## 2017-08-22 ENCOUNTER — Emergency Department (HOSPITAL_COMMUNITY)
Admission: EM | Admit: 2017-08-22 | Discharge: 2017-08-22 | Disposition: A | Discharge status: Home / Self Care | Payer: Federal, State, Local not specified - PPO | Attending: Emergency Medicine | Admitting: Emergency Medicine

## 2017-08-22 ENCOUNTER — Encounter (HOSPITAL_COMMUNITY): Payer: Self-pay | Admitting: Emergency Medicine

## 2017-08-22 DIAGNOSIS — S6991XA Unspecified injury of right wrist, hand and finger(s), initial encounter: Secondary | ICD-10-CM | POA: Diagnosis not present

## 2017-08-22 DIAGNOSIS — J45909 Unspecified asthma, uncomplicated: Secondary | ICD-10-CM | POA: Insufficient documentation

## 2017-08-22 DIAGNOSIS — Z87891 Personal history of nicotine dependence: Secondary | ICD-10-CM | POA: Diagnosis not present

## 2017-08-22 DIAGNOSIS — Y929 Unspecified place or not applicable: Secondary | ICD-10-CM | POA: Insufficient documentation

## 2017-08-22 DIAGNOSIS — W010XXA Fall on same level from slipping, tripping and stumbling without subsequent striking against object, initial encounter: Secondary | ICD-10-CM | POA: Insufficient documentation

## 2017-08-22 DIAGNOSIS — Y939 Activity, unspecified: Secondary | ICD-10-CM | POA: Diagnosis not present

## 2017-08-22 DIAGNOSIS — M79641 Pain in right hand: Secondary | ICD-10-CM | POA: Diagnosis not present

## 2017-08-22 DIAGNOSIS — Y999 Unspecified external cause status: Secondary | ICD-10-CM | POA: Diagnosis not present

## 2017-08-22 DIAGNOSIS — S62322A Displaced fracture of shaft of third metacarpal bone, right hand, initial encounter for closed fracture: Secondary | ICD-10-CM | POA: Diagnosis not present

## 2017-08-22 MED ORDER — OXYCODONE-ACETAMINOPHEN 5-325 MG PO TABS
2.0000 | ORAL_TABLET | Freq: Once | ORAL | Status: AC
  Administered 2017-08-22: 2 via ORAL
  Filled 2017-08-22: qty 2

## 2017-08-22 MED ORDER — OXYCODONE-ACETAMINOPHEN 5-325 MG PO TABS
1.0000 | ORAL_TABLET | ORAL | Status: DC | PRN
  Administered 2017-08-22: 1 via ORAL

## 2017-08-22 MED ORDER — OXYCODONE-ACETAMINOPHEN 5-325 MG PO TABS
ORAL_TABLET | ORAL | Status: DC
Start: 2017-08-22 — End: 2017-08-22
  Filled 2017-08-22: qty 1

## 2017-08-22 NOTE — Progress Notes (Signed)
Orthopedic Tech Progress Note Patient Details:  Cody Murray. December 22, 1985 [id number]  Ortho Devices Type of Ortho Device: Rad Gutter splint, Arm sling Ortho Device/Splint Location: rue Ortho Device/Splint Interventions: Ordered, Application, Adjustment   Trinna Post 08/22/2017, 5:28 AM

## 2017-08-22 NOTE — ED Triage Notes (Signed)
Patient fell tonight, landing on right hand, posterior side.  Now with swelling and pain.

## 2017-08-22 NOTE — Discharge Instructions (Signed)

## 2017-08-22 NOTE — ED Provider Notes (Signed)
[facility]   CSN: [id number] Arrival date & time: 08/22/17  0000     History   Chief Complaint Chief Complaint  Patient presents with  . Fall  . Hand Injury    HPI Cody Murray. is a 31 y.o. male.with history of asthma and allergic rhinitis who presents to the emergency department complaining of acute, persistent right hand pain onset earlier this evening after a mechanical fall. Patient reports that he slipped in the mud falling onto the dorsum of his right hand. Patient reports he did not punch anything. Patient reports that he's had pain and swelling at the site since that time. Percocet given in the waiting room has improved the pain significantly. Movement and palpation makes the symptoms worse. He denies numbness, tingling, open wounds. Patient has no history of diabetes or immunocompromise.  The history is provided by the patient and medical records. No language interpreter was used.    Past Medical History:  Diagnosis Date  . Asthma     Patient Active Problem List   Diagnosis Date Noted  . Hand pain, left 08/15/2017  . Xerosis of skin 10/15/2016  . Tinea pedis of right foot 06/04/2016  . RHINITIS, ALLERGIC 01/08/2007  . Asthma 01/08/2007    History reviewed. No pertinent surgical history.     Home Medications    Prior to Admission medications   Medication Sig Start Date End Date Taking? Authorizing Provider  albuterol (VENTOLIN HFA) 108 (90 Base) MCG/ACT inhaler Inhale 2 puffs into the lungs every 6 (six) hours as needed for wheezing or shortness of breath. 08/15/17   Reubin Milan, MD  azithromycin (ZITHROMAX Z-PAK) 250 MG tablet Take 500 mg once 08/15/17   Reubin Milan, MD  Fluticasone-Salmeterol (ADVAIR DISKUS) 250-50 MCG/DOSE AEPB Inhale 1 puff into the lungs 2 (two) times daily. 08/15/17   Reubin Milan, MD    Family History History reviewed. No pertinent family history.  Social History Social History    Substance Use Topics  . Smoking status: Former Games developer  . Smokeless tobacco: Never Used  . Alcohol use Yes     Allergies   Patient has no known allergies.   Review of Systems Review of Systems  Constitutional: Negative for chills and fever.  Gastrointestinal: Negative for nausea and vomiting.  Musculoskeletal: Positive for arthralgias and joint swelling. Negative for back pain, neck pain and neck stiffness.  Skin: Negative for wound.  Neurological: Negative for numbness.  Hematological: Does not bruise/bleed easily.  Psychiatric/Behavioral: The patient is not nervous/anxious.   All other systems reviewed and are negative.    Physical Exam Updated Vital Signs   Physical Exam  Constitutional: He appears well-developed and well-nourished. No distress.  HENT:  Head: Normocephalic and atraumatic.  Eyes: Conjunctivae are normal.  Neck: Normal range of motion.  Cardiovascular: Normal rate, regular rhythm and intact distal pulses.   Capillary refill < 3 sec  Pulmonary/Chest: Effort normal and breath sounds normal.  Musculoskeletal: He exhibits tenderness. He exhibits no edema.  Right hand: Full range of motion of all fingers. Sensation intact to the entire right upper extremity. Flexion and extension strength of 5/5 for all fingers except the right long finger. Flexion and extension strength 4/5 in the right long finger. No open wounds.  Neurological: He is alert. Coordination normal.  Skin: Skin is warm and dry. He is not diaphoretic.  No tenting of the skin  Psychiatric: He has a normal mood and affect.  Nursing note  and vitals reviewed.    ED Treatments / Results    Radiology Dg Hand Complete Right  Result Date: 08/22/2017 CLINICAL DATA:  Status post fall, with right hand pain and swelling. Initial encounter. EXAM: RIGHT HAND - COMPLETE 3+ VIEW COMPARISON:  None. FINDINGS: There is a mildly displaced oblique fracture through the mid to distal third metacarpal. There  is no evidence of intra-articular extension. The joint spaces are preserved. The carpal rows are intact, and demonstrate normal alignment. Dorsal soft tissue swelling is noted. IMPRESSION: Mildly displaced oblique fracture through the mid to distal third metacarpal. Electronically Signed   By: Roanna Raider M.D.   On: 08/22/2017 01:01    Procedures .Splint Application Date/Time: 08/22/2017 5:11 AM Performed by: Dierdre Forth Authorized by: Dierdre Forth   Consent:    Consent obtained:  Verbal   Consent given by:  Patient   Risks discussed:  Discoloration   Alternatives discussed:  No treatment Pre-procedure details:    Sensation:  Normal   Skin color:  Pink Procedure details:    Laterality:  Right   Location:  Hand   Hand:  R hand   Strapping: no     Splint type:  Radial gutter   Supplies:  Ortho-Glass Post-procedure details:    Pain:  Unchanged   Sensation:  Normal   Skin color:  Pink   Patient tolerance of procedure:  Tolerated well, no immediate complications   (including critical care time)  Medications Ordered in ED Medications  oxyCODONE-acetaminophen (PERCOCET/ROXICET) 5-325 MG per tablet 1 tablet (1 tablet Oral Given 08/22/17 0022)  oxyCODONE-acetaminophen (PERCOCET/ROXICET) 5-325 MG per tablet 2 tablet (2 tablets Oral Given 08/22/17 0435)     Initial Impression / Assessment and Plan / ED Course  I have reviewed the triage vital signs and the nursing notes.  Pertinent labs & imaging results that were available during my care of the patient were reviewed by me and considered in my medical decision making (see chart for details).     Patient X-Ray with mildly displaced oblique fracture through the mid to distal third metacarpal. Pain managed in ED. Pt advised to follow up with orthopedics for further evaluation and treatment.  Pain managed in the department. Patient given radial gutter and sling while in ED, conservative therapy recommended and  discussed. Patient will be dc home & is agreeable with above plan. I have also discussed reasons to return immediately to the ER.  Patient expresses understanding and agrees with plan.   Final Clinical Impressions(s) / ED Diagnoses   Final diagnoses:  Closed displaced fracture of shaft of third metacarpal bone of right hand, initial encounter    New Prescriptions Discharge Medication List as of 08/22/2017  5:14 AM       Deni Lefever, Dahlia Client, PA-C 08/22/17 1610    Lorre Nick, MD 08/24/17 [phone]

## 2017-08-22 NOTE — ED Notes (Signed)
Ortho tech en route to ED. 

## 2017-08-22 NOTE — ED Notes (Signed)
Patient Alert and oriented X4. Stable and ambulatory. Patient verbalized understanding of the discharge instructions.  Patient belongings were taken by the patient.  

## 2017-09-03 DIAGNOSIS — S62322A Displaced fracture of shaft of third metacarpal bone, right hand, initial encounter for closed fracture: Secondary | ICD-10-CM | POA: Diagnosis not present

## 2017-09-04 DIAGNOSIS — Y939 Activity, unspecified: Secondary | ICD-10-CM | POA: Diagnosis not present

## 2017-09-04 DIAGNOSIS — X58XXXA Exposure to other specified factors, initial encounter: Secondary | ICD-10-CM | POA: Diagnosis not present

## 2017-09-04 DIAGNOSIS — Y929 Unspecified place or not applicable: Secondary | ICD-10-CM | POA: Diagnosis not present

## 2017-09-04 DIAGNOSIS — Y999 Unspecified external cause status: Secondary | ICD-10-CM | POA: Diagnosis not present

## 2017-09-04 DIAGNOSIS — S62322B Displaced fracture of shaft of third metacarpal bone, right hand, initial encounter for open fracture: Secondary | ICD-10-CM | POA: Diagnosis not present

## 2017-09-04 DIAGNOSIS — S62322A Displaced fracture of shaft of third metacarpal bone, right hand, initial encounter for closed fracture: Secondary | ICD-10-CM | POA: Diagnosis not present

## 2017-09-29 DIAGNOSIS — S62322D Displaced fracture of shaft of third metacarpal bone, right hand, subsequent encounter for fracture with routine healing: Secondary | ICD-10-CM | POA: Diagnosis not present

## 2017-10-01 DIAGNOSIS — Z7189 Other specified counseling: Secondary | ICD-10-CM | POA: Diagnosis not present

## 2017-10-01 DIAGNOSIS — Z9189 Other specified personal risk factors, not elsewhere classified: Secondary | ICD-10-CM | POA: Diagnosis not present

## 2017-10-01 DIAGNOSIS — R369 Urethral discharge, unspecified: Secondary | ICD-10-CM | POA: Diagnosis not present

## 2017-10-13 DIAGNOSIS — B009 Herpesviral infection, unspecified: Secondary | ICD-10-CM | POA: Diagnosis not present

## 2017-12-12 DIAGNOSIS — J111 Influenza due to unidentified influenza virus with other respiratory manifestations: Secondary | ICD-10-CM | POA: Diagnosis not present

## 2018-04-26 ENCOUNTER — Other Ambulatory Visit: Payer: Self-pay | Admitting: Internal Medicine

## 2018-04-26 DIAGNOSIS — J4520 Mild intermittent asthma, uncomplicated: Secondary | ICD-10-CM

## 2018-08-20 ENCOUNTER — Inpatient Hospital Stay (HOSPITAL_COMMUNITY)
Admission: EM | Admit: 2018-08-20 | Discharge: 2018-08-21 | DRG: 200 | Disposition: A | Discharge status: Home / Self Care | Payer: Federal, State, Local not specified - PPO | Attending: General Surgery | Admitting: General Surgery

## 2018-08-20 ENCOUNTER — Other Ambulatory Visit: Payer: Self-pay

## 2018-08-20 ENCOUNTER — Emergency Department (HOSPITAL_COMMUNITY): Payer: Federal, State, Local not specified - PPO

## 2018-08-20 ENCOUNTER — Encounter (HOSPITAL_COMMUNITY): Payer: Self-pay

## 2018-08-20 DIAGNOSIS — F17210 Nicotine dependence, cigarettes, uncomplicated: Secondary | ICD-10-CM | POA: Diagnosis present

## 2018-08-20 DIAGNOSIS — S2239XA Fracture of one rib, unspecified side, initial encounter for closed fracture: Secondary | ICD-10-CM | POA: Diagnosis present

## 2018-08-20 DIAGNOSIS — Y92410 Unspecified street and highway as the place of occurrence of the external cause: Secondary | ICD-10-CM

## 2018-08-20 DIAGNOSIS — M7989 Other specified soft tissue disorders: Secondary | ICD-10-CM | POA: Diagnosis not present

## 2018-08-20 DIAGNOSIS — S2242XA Multiple fractures of ribs, left side, initial encounter for closed fracture: Secondary | ICD-10-CM | POA: Diagnosis not present

## 2018-08-20 DIAGNOSIS — Z23 Encounter for immunization: Secondary | ICD-10-CM | POA: Diagnosis not present

## 2018-08-20 DIAGNOSIS — S00532A Contusion of oral cavity, initial encounter: Secondary | ICD-10-CM | POA: Diagnosis present

## 2018-08-20 DIAGNOSIS — M25561 Pain in right knee: Secondary | ICD-10-CM | POA: Diagnosis not present

## 2018-08-20 DIAGNOSIS — Z87891 Personal history of nicotine dependence: Secondary | ICD-10-CM

## 2018-08-20 DIAGNOSIS — J45909 Unspecified asthma, uncomplicated: Secondary | ICD-10-CM | POA: Diagnosis not present

## 2018-08-20 DIAGNOSIS — S27321A Contusion of lung, unilateral, initial encounter: Secondary | ICD-10-CM | POA: Diagnosis present

## 2018-08-20 DIAGNOSIS — S70212A Abrasion, left hip, initial encounter: Secondary | ICD-10-CM | POA: Diagnosis not present

## 2018-08-20 DIAGNOSIS — S272XXA Traumatic hemopneumothorax, initial encounter: Principal | ICD-10-CM | POA: Diagnosis present

## 2018-08-20 DIAGNOSIS — S8991XA Unspecified injury of right lower leg, initial encounter: Secondary | ICD-10-CM | POA: Diagnosis not present

## 2018-08-20 DIAGNOSIS — S3991XA Unspecified injury of abdomen, initial encounter: Secondary | ICD-10-CM | POA: Diagnosis not present

## 2018-08-20 DIAGNOSIS — S27329A Contusion of lung, unspecified, initial encounter: Secondary | ICD-10-CM | POA: Diagnosis not present

## 2018-08-20 HISTORY — DX: Fracture of one rib, unspecified side, initial encounter for closed fracture: S22.39XA

## 2018-08-20 LAB — HEPATIC FUNCTION PANEL
ALT: 53 U/L — AB (ref 0–44)
AST: 86 U/L — ABNORMAL HIGH (ref 15–41)
Albumin: 4.8 g/dL (ref 3.5–5.0)
Alkaline Phosphatase: 63 U/L (ref 38–126)
BILIRUBIN DIRECT: 0.1 mg/dL (ref 0.0–0.2)
BILIRUBIN INDIRECT: 1 mg/dL — AB (ref 0.3–0.9)
Total Bilirubin: 1.1 mg/dL (ref 0.3–1.2)
Total Protein: 8.4 g/dL — ABNORMAL HIGH (ref 6.5–8.1)

## 2018-08-20 LAB — BASIC METABOLIC PANEL
Anion gap: 12 (ref 5–15)
BUN: 15 mg/dL (ref 6–20)
CALCIUM: 9.1 mg/dL (ref 8.9–10.3)
CO2: 24 mmol/L (ref 22–32)
CREATININE: 1.21 mg/dL (ref 0.61–1.24)
Chloride: 104 mmol/L (ref 98–111)
GFR calc non Af Amer: >60 mL/min (ref >60)
Glucose, Bld: 120 mg/dL — ABNORMAL HIGH (ref 70–99)
Potassium: 3.9 mmol/L (ref 3.5–5.1)
SODIUM: 140 mmol/L (ref 135–145)

## 2018-08-20 LAB — CBC
HCT: 48.5 % (ref 39.0–52.0)
Hemoglobin: 15.7 g/dL (ref 13.0–17.0)
MCH: 29 pg (ref 26.0–34.0)
MCHC: 32.4 g/dL (ref 30.0–36.0)
MCV: 89.5 fL (ref 80.0–100.0)
Platelets: 268 10*3/uL (ref 150–400)
RBC: 5.42 MIL/uL (ref 4.22–5.81)
RDW: 13.2 % (ref 11.5–15.5)
WBC: 7.5 10*3/uL (ref 4.0–10.5)
nRBC: 0 % (ref 0.0–0.2)

## 2018-08-20 LAB — HIV ANTIBODY (ROUTINE TESTING W REFLEX): HIV SCREEN 4TH GENERATION: NONREACTIVE

## 2018-08-20 MED ORDER — ALBUTEROL SULFATE (2.5 MG/3ML) 0.083% IN NEBU: 3.0000 mL | INHALATION_SOLUTION | Freq: Four times a day (QID) | RESPIRATORY_TRACT | Status: DC | PRN

## 2018-08-20 MED ORDER — POLYETHYLENE GLYCOL 3350 17 G PO PACK
17.0000 g | PACK | Freq: Every day | ORAL | Status: DC
  Administered 2018-08-21: 17 g via ORAL
  Filled 2018-08-20: qty 1

## 2018-08-20 MED ORDER — ONDANSETRON HCL 4 MG/2ML IJ SOLN: 4.0000 mg | Freq: Four times a day (QID) | INTRAMUSCULAR | Status: DC | PRN

## 2018-08-20 MED ORDER — HYDROMORPHONE HCL 1 MG/ML IJ SOLN
1.0000 mg | Freq: Once | INTRAMUSCULAR | Status: AC
  Administered 2018-08-20: 1 mg via INTRAVENOUS
  Filled 2018-08-20: qty 1

## 2018-08-20 MED ORDER — IOPAMIDOL (ISOVUE-300) INJECTION 61%
INTRAVENOUS | Status: AC
  Filled 2018-08-20: qty 100

## 2018-08-20 MED ORDER — ONDANSETRON 4 MG PO TBDP: 4.0000 mg | ORAL_TABLET | Freq: Four times a day (QID) | ORAL | Status: DC | PRN

## 2018-08-20 MED ORDER — DOCUSATE SODIUM 100 MG PO CAPS
100.0000 mg | ORAL_CAPSULE | Freq: Two times a day (BID) | ORAL | Status: DC
  Administered 2018-08-20 – 2018-08-21 (×3): 100 mg via ORAL
  Filled 2018-08-20 (×3): qty 1

## 2018-08-20 MED ORDER — MORPHINE SULFATE (PF) 4 MG/ML IV SOLN
4.0000 mg | Freq: Once | INTRAVENOUS | Status: AC
  Administered 2018-08-20: 4 mg via INTRAVENOUS
  Filled 2018-08-20: qty 1

## 2018-08-20 MED ORDER — HYDRALAZINE HCL 20 MG/ML IJ SOLN: 10.0000 mg | INTRAMUSCULAR | Status: DC | PRN

## 2018-08-20 MED ORDER — METOPROLOL TARTRATE 5 MG/5ML IV SOLN: 5.0000 mg | Freq: Four times a day (QID) | INTRAVENOUS | Status: DC | PRN

## 2018-08-20 MED ORDER — TETANUS-DIPHTH-ACELL PERTUSSIS 5-2.5-18.5 LF-MCG/0.5 IM SUSP
0.5000 mL | Freq: Once | INTRAMUSCULAR | Status: AC
  Administered 2018-08-20: 0.5 mL via INTRAMUSCULAR
  Filled 2018-08-20: qty 0.5

## 2018-08-20 MED ORDER — METHOCARBAMOL 1000 MG/10ML IJ SOLN
1000.0000 mg | Freq: Once | INTRAVENOUS | Status: AC
  Administered 2018-08-20: 1000 mg via INTRAVENOUS
  Filled 2018-08-20: qty 10

## 2018-08-20 MED ORDER — ACETAMINOPHEN 500 MG PO TABS
1000.0000 mg | ORAL_TABLET | Freq: Four times a day (QID) | ORAL | Status: DC
  Administered 2018-08-20 – 2018-08-21 (×6): 1000 mg via ORAL
  Filled 2018-08-20 (×6): qty 2

## 2018-08-20 MED ORDER — SODIUM CHLORIDE 0.9 % IV SOLN
INTRAVENOUS | Status: DC
  Administered 2018-08-20 – 2018-08-21 (×2): via INTRAVENOUS

## 2018-08-20 MED ORDER — ENOXAPARIN SODIUM 40 MG/0.4ML ~~LOC~~ SOLN
40.0000 mg | SUBCUTANEOUS | Status: DC
  Administered 2018-08-20 – 2018-08-21 (×2): 40 mg via SUBCUTANEOUS
  Filled 2018-08-20 (×2): qty 0.4

## 2018-08-20 MED ORDER — PNEUMOCOCCAL VAC POLYVALENT 25 MCG/0.5ML IJ INJ
0.5000 mL | INJECTION | INTRAMUSCULAR | Status: DC
  Filled 2018-08-20: qty 0.5

## 2018-08-20 MED ORDER — METHOCARBAMOL 500 MG PO TABS
500.0000 mg | ORAL_TABLET | Freq: Three times a day (TID) | ORAL | Status: DC
  Administered 2018-08-20 (×3): 500 mg via ORAL
  Filled 2018-08-20 (×3): qty 1

## 2018-08-20 MED ORDER — METHOCARBAMOL 1000 MG/10ML IJ SOLN: 1000.0000 mg | Freq: Once | INTRAMUSCULAR | Status: DC

## 2018-08-20 MED ORDER — IBUPROFEN 600 MG PO TABS
600.0000 mg | ORAL_TABLET | Freq: Three times a day (TID) | ORAL | Status: DC
  Administered 2018-08-20 – 2018-08-21 (×4): 600 mg via ORAL
  Filled 2018-08-20 (×4): qty 1

## 2018-08-20 MED ORDER — MORPHINE SULFATE (PF) 2 MG/ML IV SOLN: 1.0000 mg | INTRAVENOUS | Status: DC | PRN

## 2018-08-20 MED ORDER — IOPAMIDOL (ISOVUE-300) INJECTION 61%
100.0000 mL | Freq: Once | INTRAVENOUS | Status: AC | PRN
  Administered 2018-08-20: 100 mL via INTRAVENOUS

## 2018-08-20 MED ORDER — INFLUENZA VAC SPLIT QUAD 0.5 ML IM SUSY
0.5000 mL | PREFILLED_SYRINGE | INTRAMUSCULAR | Status: DC
  Filled 2018-08-20: qty 0.5

## 2018-08-20 MED ORDER — ONDANSETRON HCL 4 MG/2ML IJ SOLN
4.0000 mg | Freq: Once | INTRAMUSCULAR | Status: AC
  Administered 2018-08-20: 4 mg via INTRAVENOUS
  Filled 2018-08-20: qty 2

## 2018-08-20 MED ORDER — SODIUM CHLORIDE 0.9 % IJ SOLN
INTRAMUSCULAR | Status: AC
  Filled 2018-08-20: qty 50

## 2018-08-20 MED ORDER — OXYCODONE HCL 5 MG PO TABS
5.0000 mg | ORAL_TABLET | ORAL | Status: DC | PRN
  Administered 2018-08-20 – 2018-08-21 (×4): 10 mg via ORAL
  Filled 2018-08-20 (×4): qty 2

## 2018-08-20 MED ORDER — PANTOPRAZOLE SODIUM 20 MG PO TBEC
20.0000 mg | DELAYED_RELEASE_TABLET | Freq: Every day | ORAL | Status: DC
  Administered 2018-08-20 – 2018-08-21 (×2): 20 mg via ORAL
  Filled 2018-08-20 (×2): qty 1

## 2018-08-20 MED ORDER — MOMETASONE FURO-FORMOTEROL FUM 200-5 MCG/ACT IN AERO
1.0000 | INHALATION_SPRAY | Freq: Two times a day (BID) | RESPIRATORY_TRACT | Status: DC
  Administered 2018-08-20 – 2018-08-21 (×3): 1 via RESPIRATORY_TRACT
  Filled 2018-08-20: qty 8.8
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
## 2018-08-20 NOTE — ED Notes (Signed)
Attempted to call report to 6N. Per staff: Unable to take report at this time due to a rapid response situation on the floor. RN to call this RN back when able.
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
## 2018-08-20 NOTE — Plan of Care (Signed)
  Problem: Education: Goal: Knowledge of General Education information will improve Description Including pain rating scale, medication(s)/side effects and non-pharmacologic comfort measures Outcome: Progressing   Problem: Pain Managment: Goal: General experience of comfort will improve 08/20/2018 1722 by Montez Hageman, RN Outcome: Progressing 08/20/2018 1048 by Montez Hageman, RN Outcome: Progressing
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
## 2018-08-20 NOTE — ED Notes (Signed)
Pt transferred from room 5 ED to [facility] via [facility]

## 2018-08-20 NOTE — ED Notes (Signed)
Admitting PA at bedside.

## 2018-08-20 NOTE — Plan of Care (Signed)
  Problem: Pain Managment: Goal: General experience of comfort will improve Outcome: Progressing   

## 2018-08-20 NOTE — ED Notes (Addendum)
[facility] Notified need for transport 
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
## 2018-08-20 NOTE — ED Notes (Signed)
Pt using incentive spirometer that was provided by PA at this time.
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
## 2018-08-20 NOTE — ED Notes (Signed)
Report given to [facility]. 

## 2018-08-21 ENCOUNTER — Inpatient Hospital Stay (HOSPITAL_COMMUNITY): Payer: Federal, State, Local not specified - PPO

## 2018-08-21 DIAGNOSIS — M7989 Other specified soft tissue disorders: Secondary | ICD-10-CM | POA: Diagnosis not present

## 2018-08-21 DIAGNOSIS — S8991XA Unspecified injury of right lower leg, initial encounter: Secondary | ICD-10-CM | POA: Diagnosis not present

## 2018-08-21 DIAGNOSIS — M25561 Pain in right knee: Secondary | ICD-10-CM | POA: Diagnosis not present

## 2018-08-21 DIAGNOSIS — S2242XA Multiple fractures of ribs, left side, initial encounter for closed fracture: Secondary | ICD-10-CM | POA: Diagnosis not present

## 2018-08-21 LAB — BASIC METABOLIC PANEL
ANION GAP: 8 (ref 5–15)
BUN: 15 mg/dL (ref 6–20)
CHLORIDE: 104 mmol/L (ref 98–111)
CO2: 26 mmol/L (ref 22–32)
Calcium: 8.6 mg/dL — ABNORMAL LOW (ref 8.9–10.3)
Creatinine, Ser: 1.23 mg/dL (ref 0.61–1.24)
GFR calc Af Amer: >60 mL/min (ref >60)
Glucose, Bld: 148 mg/dL — ABNORMAL HIGH (ref 70–99)
POTASSIUM: 3.5 mmol/L (ref 3.5–5.1)
SODIUM: 138 mmol/L (ref 135–145)

## 2018-08-21 LAB — CBC
HCT: 42.7 % (ref 39.0–52.0)
HEMOGLOBIN: 13.9 g/dL (ref 13.0–17.0)
MCH: 28.9 pg (ref 26.0–34.0)
MCHC: 32.6 g/dL (ref 30.0–36.0)
MCV: 88.8 fL (ref 80.0–100.0)
NRBC: 0 % (ref 0.0–0.2)
PLATELETS: 255 10*3/uL (ref 150–400)
RBC: 4.81 MIL/uL (ref 4.22–5.81)
RDW: 13.3 % (ref 11.5–15.5)
WBC: 7.1 10*3/uL (ref 4.0–10.5)

## 2018-08-21 MED ORDER — OXYCODONE HCL 5 MG PO TABS: 5.0000 mg | ORAL_TABLET | ORAL | Status: DC | PRN

## 2018-08-21 MED ORDER — DOCUSATE SODIUM 100 MG PO CAPS: 100.0000 mg | ORAL_CAPSULE | Freq: Two times a day (BID) | ORAL | 0 refills | Status: DC

## 2018-08-21 MED ORDER — POLYETHYLENE GLYCOL 3350 17 G PO PACK: 17.0000 g | PACK | Freq: Every day | ORAL | 0 refills | Status: DC

## 2018-08-21 MED ORDER — OXYCODONE HCL 5 MG PO TABS: 5.0000 mg | ORAL_TABLET | Freq: Four times a day (QID) | ORAL | 0 refills | Status: DC | PRN

## 2018-08-21 MED ORDER — ACETAMINOPHEN 500 MG PO TABS: 1000.0000 mg | ORAL_TABLET | Freq: Four times a day (QID) | ORAL | 0 refills | Status: DC | PRN

## 2018-08-21 MED ORDER — METHOCARBAMOL 500 MG PO TABS: 500.0000 mg | ORAL_TABLET | Freq: Four times a day (QID) | ORAL | 0 refills | Status: DC | PRN

## 2018-08-21 MED ORDER — METHOCARBAMOL 500 MG PO TABS
500.0000 mg | ORAL_TABLET | Freq: Four times a day (QID) | ORAL | Status: DC
  Administered 2018-08-21 (×2): 500 mg via ORAL
  Filled 2018-08-21 (×2): qty 1

## 2018-08-21 MED ORDER — MORPHINE SULFATE (PF) 2 MG/ML IV SOLN: 1.0000 mg | INTRAVENOUS | Status: DC | PRN

## 2018-08-21 MED ORDER — IBUPROFEN 600 MG PO TABS: 600.0000 mg | ORAL_TABLET | Freq: Three times a day (TID) | ORAL | 0 refills | Status: DC | PRN
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
## 2018-08-21 NOTE — Discharge Instructions (Signed)

## 2018-08-21 NOTE — Progress Notes (Signed)
Discharge home. Home discharge instruction given, no question verbalized. 
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
## 2018-08-25 ENCOUNTER — Telehealth: Payer: Self-pay
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
## 2018-08-28 ENCOUNTER — Ambulatory Visit (INDEPENDENT_AMBULATORY_CARE_PROVIDER_SITE_OTHER): Payer: Federal, State, Local not specified - PPO | Admitting: Internal Medicine

## 2018-08-28 ENCOUNTER — Encounter: Payer: Self-pay | Admitting: Internal Medicine

## 2018-08-28 VITALS — BP 136/78 | HR 85 | Ht 72.0 in | Wt 215.0 lb

## 2018-08-28 DIAGNOSIS — S161XXA Strain of muscle, fascia and tendon at neck level, initial encounter: Secondary | ICD-10-CM | POA: Diagnosis not present

## 2018-08-28 DIAGNOSIS — S2249XA Multiple fractures of ribs, unspecified side, initial encounter for closed fracture: Secondary | ICD-10-CM
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
## 2018-08-28 NOTE — Patient Instructions (Signed)
Take Ibuprofen 20 tablets with one methocarbamol every 6 hours  Start tapering back on the oxycodone as you are able.

## 2018-09-21 ENCOUNTER — Ambulatory Visit (INDEPENDENT_AMBULATORY_CARE_PROVIDER_SITE_OTHER): Payer: Federal, State, Local not specified - PPO | Admitting: Internal Medicine

## 2018-09-21 ENCOUNTER — Encounter: Payer: Self-pay | Admitting: Internal Medicine

## 2018-09-21 VITALS — BP 122/78 | HR 84 | Resp 16 | Ht 72.0 in | Wt 215.0 lb

## 2018-09-21 DIAGNOSIS — Z23 Encounter for immunization: Secondary | ICD-10-CM | POA: Diagnosis not present

## 2018-09-21 DIAGNOSIS — S2249XA Multiple fractures of ribs, unspecified side, initial encounter for closed fracture: Secondary | ICD-10-CM

## 2018-09-21 DIAGNOSIS — J4520 Mild intermittent asthma, uncomplicated: Secondary | ICD-10-CM | POA: Diagnosis not present

## 2018-09-21 MED ORDER — METHOCARBAMOL 500 MG PO TABS: 500.0000 mg | ORAL_TABLET | Freq: Four times a day (QID) | ORAL | 0 refills | Status: DC | PRN

## 2018-09-21 MED ORDER — ALBUTEROL SULFATE HFA 108 (90 BASE) MCG/ACT IN AERS: 2.0000 | INHALATION_SPRAY | Freq: Four times a day (QID) | RESPIRATORY_TRACT | 0 refills | Status: DC | PRN
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
## 2019-06-15 ENCOUNTER — Other Ambulatory Visit: Payer: Self-pay | Admitting: Internal Medicine

## 2019-06-15 DIAGNOSIS — J4520 Mild intermittent asthma, uncomplicated: Secondary | ICD-10-CM

## 2019-07-11 ENCOUNTER — Other Ambulatory Visit: Payer: Self-pay | Admitting: Internal Medicine

## 2019-07-11 DIAGNOSIS — J4520 Mild intermittent asthma, uncomplicated: Secondary | ICD-10-CM
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
## 2019-10-29 ENCOUNTER — Other Ambulatory Visit: Payer: Self-pay | Admitting: Internal Medicine

## 2019-10-29 DIAGNOSIS — J4520 Mild intermittent asthma, uncomplicated: Secondary | ICD-10-CM

## 2019-12-12 ENCOUNTER — Other Ambulatory Visit: Payer: Self-pay | Admitting: Internal Medicine

## 2019-12-12 DIAGNOSIS — J4520 Mild intermittent asthma, uncomplicated: Secondary | ICD-10-CM

## 2020-01-07 ENCOUNTER — Other Ambulatory Visit: Payer: Self-pay | Admitting: Internal Medicine

## 2020-01-07 DIAGNOSIS — J4520 Mild intermittent asthma, uncomplicated: Secondary | ICD-10-CM

## 2020-01-07 NOTE — Telephone Encounter (Signed)
The number that was listed on his profile was no longer his number. As of now we do not have a number for him.

## 2020-02-05 ENCOUNTER — Other Ambulatory Visit: Payer: Self-pay | Admitting: Internal Medicine

## 2020-02-05 DIAGNOSIS — J4520 Mild intermittent asthma, uncomplicated: Secondary | ICD-10-CM

## 2020-02-29 ENCOUNTER — Encounter: Payer: Self-pay | Admitting: Internal Medicine

## 2020-02-29 NOTE — Telephone Encounter (Signed)
Mailing out letter.

## 2020-03-15 ENCOUNTER — Other Ambulatory Visit: Payer: Self-pay | Admitting: Internal Medicine

## 2020-03-15 DIAGNOSIS — J4520 Mild intermittent asthma, uncomplicated: Secondary | ICD-10-CM

## 2020-03-15 NOTE — Telephone Encounter (Signed)
Requested Prescriptions  Pending Prescriptions Disp Refills  . WIXELA INHUB 250-50 MCG/DOSE AEPB [Pharmacy Med Name: WIXELA 250-50 INHUB] 60 each 0    Sig: TAKE 1 PUFF BY MOUTH TWICE A DAY. SCHEDULE APPT WITH BERGLUND     Pulmonology:  Combination Products Failed - 03/15/2020  1:11 PM      Failed - Valid encounter within last 12 months    Recent Outpatient Visits          1 year ago Multiple rib fractures involving four or more ribs   [facility] Reubin Milan, MD   1 year ago Multiple rib fractures involving four or more ribs   [facility] Reubin Milan, MD   2 years ago Mild intermittent asthma without complication   [facility] Reubin Milan, MD   3 years ago Pharyngitis, unspecified etiology   [facility] Reubin Milan, MD   3 years ago Asthma, mild intermittent, uncomplicated   [facility] Reubin Milan, MD      Future Appointments            In 1 week Judithann Graves Nyoka Cowden, MD [facility]

## 2020-03-15 NOTE — Telephone Encounter (Signed)
ADVAIR DISKUS 250-50 MCG/DOSE AEPB [[id number]]     Patient requesting refill.    Pharmacy:  [id number] [place], [place] - [address] Phone:  [phone]  Fax:  [phone]

## 2020-03-23 ENCOUNTER — Encounter: Payer: Federal, State, Local not specified - PPO | Admitting: Internal Medicine

## 2020-03-27 ENCOUNTER — Ambulatory Visit (INDEPENDENT_AMBULATORY_CARE_PROVIDER_SITE_OTHER): Payer: Federal, State, Local not specified - PPO | Admitting: Internal Medicine

## 2020-03-27 ENCOUNTER — Encounter: Payer: Self-pay | Admitting: Internal Medicine

## 2020-03-27 ENCOUNTER — Other Ambulatory Visit: Payer: Self-pay

## 2020-03-27 VITALS — BP 136/88 | HR 93 | Temp 97.5°F | Ht 72.0 in | Wt 235.0 lb

## 2020-03-27 DIAGNOSIS — Z0000 Encounter for general adult medical examination without abnormal findings: Secondary | ICD-10-CM | POA: Diagnosis not present

## 2020-03-27 DIAGNOSIS — J4520 Mild intermittent asthma, uncomplicated: Secondary | ICD-10-CM

## 2020-03-27 DIAGNOSIS — Z23 Encounter for immunization: Secondary | ICD-10-CM | POA: Diagnosis not present

## 2020-03-27 DIAGNOSIS — R002 Palpitations: Secondary | ICD-10-CM | POA: Diagnosis not present

## 2020-03-27 LAB — POCT URINALYSIS DIPSTICK
Bilirubin, UA: NEGATIVE
Blood, UA: NEGATIVE
Glucose, UA: NEGATIVE
Ketones, UA: NEGATIVE
Leukocytes, UA: NEGATIVE
Nitrite, UA: NEGATIVE
Protein, UA: NEGATIVE
Spec Grav, UA: 1.015 (ref 1.010–1.025)
Urobilinogen, UA: 0.2 E.U./dL
pH, UA: 6 (ref 5.0–8.0)

## 2020-03-27 MED ORDER — ALBUTEROL SULFATE HFA 108 (90 BASE) MCG/ACT IN AERS: 2.0000 | INHALATION_SPRAY | Freq: Four times a day (QID) | RESPIRATORY_TRACT | 5 refills | Status: AC | PRN

## 2020-03-27 MED ORDER — FLUTICASONE-SALMETEROL 250-50 MCG/DOSE IN AEPB: 1.0000 | INHALATION_SPRAY | Freq: Two times a day (BID) | RESPIRATORY_TRACT | 12 refills | Status: DC

## 2020-03-27 NOTE — Progress Notes (Signed)
Date:  03/27/2020   Name:  Cody Murray.   DOB:  1986-06-10   MRN:  [id number]   Chief Complaint: Annual Exam Azhar Knope. is a 34 y.o. male who presents today for his Complete Annual Exam. He feels fairly well. He reports exercising regularly. He reports he is sleeping fairly well.   Immunization History  Administered Date(s) Administered  . Influenza,inj,Quad PF,6+ Mos 08/15/2017, 09/21/2018  . Td 03/11/2004  . Tdap 08/20/2018    Asthma There is no shortness of breath or wheezing. This is a chronic problem. The problem occurs rarely. Pertinent negatives include no appetite change, chest pain, headaches, myalgias or trouble swallowing. His symptoms are aggravated by exercise and pollen. His symptoms are alleviated by steroid inhaler and beta-agonist. He reports significant improvement on treatment. His past medical history is significant for asthma.    Lab Results  Component Value Date   CREATININE 1.23 08/21/2018   BUN 15 08/21/2018   NA 138 08/21/2018   K 3.5 08/21/2018   CL 104 08/21/2018   CO2 26 08/21/2018    Lab Results  Component Value Date   WBC 7.1 08/21/2018   HGB 13.9 08/21/2018   HCT 42.7 08/21/2018   MCV 88.8 08/21/2018   PLT 255 08/21/2018   Lab Results  Component Value Date   ALT 53 (H) 08/20/2018   AST 86 (H) 08/20/2018   ALKPHOS 63 08/20/2018   BILITOT 1.1 08/20/2018     Review of Systems  Constitutional: Negative for appetite change, chills, diaphoresis, fatigue and unexpected weight change.  HENT: Negative for hearing loss, tinnitus, trouble swallowing and voice change.   Eyes: Negative for visual disturbance.  Respiratory: Negative for choking, shortness of breath and wheezing.   Cardiovascular: Negative for chest pain, palpitations and leg swelling.       One episode several weeks ago that felt like a skipped heart beat.  No associated sx.    Gastrointestinal: Negative for abdominal pain, blood in stool, constipation and  diarrhea.  Genitourinary: Negative for difficulty urinating, dysuria and frequency.  Musculoskeletal: Negative for arthralgias, back pain and myalgias.  Skin: Negative for color change and rash.  Allergic/Immunologic: Negative for environmental allergies.  Neurological: Negative for dizziness, syncope and headaches.  Hematological: Negative for adenopathy.  Psychiatric/Behavioral: Negative for dysphoric mood and sleep disturbance.    Patient Active Problem List   Diagnosis Date Noted  . Mild intermittent asthma without complication 01/08/2007    No Known Allergies  Past Surgical History:  Procedure Laterality Date  . OPEN REDUCTION INTERNAL FIXATION (ORIF) HAND Right     Social History   Tobacco Use  . Smoking status: Former Smoker    Types: Cigarettes  . Smokeless tobacco: Never Used  Substance Use Topics  . Alcohol use: Yes    Comment: occasionally is as specific as he will be  . Drug use: No     Medication list has been reviewed and updated.  Current Meds  Medication Sig  . Fluticasone-Salmeterol (ADVAIR DISKUS) 100-50 MCG/DOSE AEPB Inhale 1 puff into the lungs 2 (two) times daily.    PHQ 2/9 Scores 03/27/2020 08/28/2018 08/15/2017 04/03/2016  PHQ - 2 Score 0 0 0 0  PHQ- 9 Score 0 - - -    BP Readings from Last 3 Encounters:  03/27/20 136/88  09/21/18 122/78  08/28/18 136/78    Physical Exam Vitals and nursing note reviewed.  Constitutional:      Appearance: Normal appearance. He is well-developed.  HENT:     Head: Normocephalic.     Right Ear: Tympanic membrane, ear canal and external ear normal.     Left Ear: Tympanic membrane, ear canal and external ear normal.     Nose: Nose normal.     Mouth/Throat:     Pharynx: Uvula midline.  Eyes:     Conjunctiva/sclera: Conjunctivae normal.     Pupils: Pupils are equal, round, and reactive to light.  Neck:     Thyroid: No thyromegaly.     Vascular: No carotid bruit.  Cardiovascular:     Rate and Rhythm:  Normal rate and regular rhythm.  No extrasystoles are present.    Pulses: Normal pulses.     Heart sounds: Normal heart sounds. No murmur.  Pulmonary:     Effort: Pulmonary effort is normal.     Breath sounds: Normal breath sounds. No wheezing.  Chest:     Breasts:        Right: No mass.        Left: No mass.  Abdominal:     General: Bowel sounds are normal.     Palpations: Abdomen is soft.     Tenderness: There is no abdominal tenderness. There is no guarding or rebound.     Hernia: No hernia is present.  Musculoskeletal:        General: Normal range of motion.     Cervical back: Normal range of motion and neck supple.     Right lower leg: No edema.     Left lower leg: No edema.  Lymphadenopathy:     Cervical: No cervical adenopathy.  Skin:    General: Skin is warm and dry.     Capillary Refill: Capillary refill takes less than 2 seconds.  Neurological:     General: No focal deficit present.     Mental Status: He is alert and oriented to person, place, and time.     Deep Tendon Reflexes: Reflexes are normal and symmetric.  Psychiatric:        Mood and Affect: Mood normal.        Speech: Speech normal.     Wt Readings from Last 3 Encounters:  03/27/20 235 lb (106.6 kg)  09/21/18 215 lb (97.5 kg)  08/28/18 215 lb (97.5 kg)    BP 136/88   Pulse 93   Temp (!) 97.5 F (36.4 C) (Temporal)   Ht 6' (1.829 m)   Wt 235 lb (106.6 kg)   SpO2 96%   BMI 31.87 kg/m   Assessment and Plan: 1. Annual physical exam Normal exam except for weight - DASH diet - CBC with Differential/Platelet - Comprehensive metabolic panel - Lipid panel - POCT urinalysis dipstick  2. Mild intermittent asthma without complication Controlled with no complications - Fluticasone-Salmeterol (WIXELA INHUB) 250-50 MCG/DOSE AEPB; Inhale 1 puff into the lungs 2 (two) times daily.  Dispense: 60 each; Refill: 12 - albuterol (PROAIR HFA) 108 (90 Base) MCG/ACT inhaler; Inhale 2 puffs into the lungs every  6 (six) hours as needed for wheezing or shortness of breath.  Dispense: 18 g; Refill: 5  3. Need for vaccination for pneumococcus - Pneumococcal polysaccharide vaccine 23-valent greater than or equal to 2yo subcutaneous/IM  4. Palpitations Pt reassured regarding palpitations - he needs to cut back on caffeine - handout given Return if sx worsen   Partially dictated using Editor, commissioning. Any errors are unintentional.  Halina Maidens, MD [facility]  03/27/2020  Partially dictated using Animal nutritionist. Any errors are unintentional.  Bari Edward, MD [facility]  03/27/2020

## 2020-03-27 NOTE — Patient Instructions (Addendum)
DASH Eating Plan DASH stands for "Dietary Approaches to Stop Hypertension." The DASH eating plan is a healthy eating plan that has been shown to reduce high blood pressure (hypertension). It may also reduce your risk for type 2 diabetes, heart disease, and stroke. The DASH eating plan may also help with weight loss. What are tips for following this plan?  General guidelines  Avoid eating more than 2,300 mg (milligrams) of salt (sodium) a day. If you have hypertension, you may need to reduce your sodium intake to 1,500 mg a day.  Limit alcohol intake to no more than 1 drink a day for nonpregnant women and 2 drinks a day for men. One drink equals 12 oz of beer, 5 oz of wine, or 1 oz of hard liquor.  Work with your health care provider to maintain a healthy body weight or to lose weight. Ask what an ideal weight is for you.  Get at least 30 minutes of exercise that causes your heart to beat faster (aerobic exercise) most days of the week. Activities may include walking, swimming, or biking.  Work with your health care provider or diet and nutrition specialist (dietitian) to adjust your eating plan to your individual calorie needs. Reading food labels   Check food labels for the amount of sodium per serving. Choose foods with less than 5 percent of the Daily Value of sodium. Generally, foods with less than 300 mg of sodium per serving fit into this eating plan.  To find whole grains, look for the word "whole" as the first word in the ingredient list. Shopping  Buy products labeled as "low-sodium" or "no salt added."  Buy fresh foods. Avoid canned foods and premade or frozen meals. Cooking  Avoid adding salt when cooking. Use salt-free seasonings or herbs instead of table salt or sea salt. Check with your health care provider or pharmacist before using salt substitutes.  Do not fry foods. Cook foods using healthy methods such as baking, boiling, grilling, and broiling instead.  Cook with  heart-healthy oils, such as olive, canola, soybean, or sunflower oil. Meal planning  Eat a balanced diet that includes: ? 5 or more servings of fruits and vegetables each day. At each meal, try to fill half of your plate with fruits and vegetables. ? Up to 6-8 servings of whole grains each day. ? Less than 6 oz of lean meat, poultry, or fish each day. A 3-oz serving of meat is about the same size as a deck of cards. One egg equals 1 oz. ? 2 servings of low-fat dairy each day. ? A serving of nuts, seeds, or beans 5 times each week. ? Heart-healthy fats. Healthy fats called Omega-3 fatty acids are found in foods such as flaxseeds and coldwater fish, like sardines, salmon, and mackerel.  Limit how much you eat of the following: ? Canned or prepackaged foods. ? Food that is high in trans fat, such as fried foods. ? Food that is high in saturated fat, such as fatty meat. ? Sweets, desserts, sugary drinks, and other foods with added sugar. ? Full-fat dairy products.  Do not salt foods before eating.  Try to eat at least 2 vegetarian meals each week.  Eat more home-cooked food and less restaurant, buffet, and fast food.  When eating at a restaurant, ask that your food be prepared with less salt or no salt, if possible. What foods are recommended? The items listed may not be a complete list. Talk with your dietitian about   what dietary choices are best for you. Grains Whole-grain or whole-wheat bread. Whole-grain or whole-wheat pasta. Brown rice. Oatmeal. Quinoa. Bulgur. Whole-grain and low-sodium cereals. Pita bread. Low-fat, low-sodium crackers. Whole-wheat flour tortillas. Vegetables Fresh or frozen vegetables (raw, steamed, roasted, or grilled). Low-sodium or reduced-sodium tomato and vegetable juice. Low-sodium or reduced-sodium tomato sauce and tomato paste. Low-sodium or reduced-sodium canned vegetables. Fruits All fresh, dried, or frozen fruit. Canned fruit in natural juice (without  added sugar). Meat and other protein foods Skinless chicken or turkey. Ground chicken or turkey. Pork with fat trimmed off. Fish and seafood. Egg whites. Dried beans, peas, or lentils. Unsalted nuts, nut butters, and seeds. Unsalted canned beans. Lean cuts of beef with fat trimmed off. Low-sodium, lean deli meat. Dairy Low-fat (1%) or fat-free (skim) milk. Fat-free, low-fat, or reduced-fat cheeses. Nonfat, low-sodium ricotta or cottage cheese. Low-fat or nonfat yogurt. Low-fat, low-sodium cheese. Fats and oils Soft margarine without trans fats. Vegetable oil. Low-fat, reduced-fat, or light mayonnaise and salad dressings (reduced-sodium). Canola, safflower, olive, soybean, and sunflower oils. Avocado. Seasoning and other foods Herbs. Spices. Seasoning mixes without salt. Unsalted popcorn and pretzels. Fat-free sweets. What foods are not recommended? The items listed may not be a complete list. Talk with your dietitian about what dietary choices are best for you. Grains Baked goods made with fat, such as croissants, muffins, or some breads. Dry pasta or rice meal packs. Vegetables Creamed or fried vegetables. Vegetables in a cheese sauce. Regular canned vegetables (not low-sodium or reduced-sodium). Regular canned tomato sauce and paste (not low-sodium or reduced-sodium). Regular tomato and vegetable juice (not low-sodium or reduced-sodium). Pickles. Olives. Fruits Canned fruit in a light or heavy syrup. Fried fruit. Fruit in cream or butter sauce. Meat and other protein foods Fatty cuts of meat. Ribs. Fried meat. Bacon. Sausage. Bologna and other processed lunch meats. Salami. Fatback. Hotdogs. Bratwurst. Salted nuts and seeds. Canned beans with added salt. Canned or smoked fish. Whole eggs or egg yolks. Chicken or turkey with skin. Dairy Whole or 2% milk, cream, and half-and-half. Whole or full-fat cream cheese. Whole-fat or sweetened yogurt. Full-fat cheese. Nondairy creamers. Whipped toppings.  Processed cheese and cheese spreads. Fats and oils Butter. Stick margarine. Lard. Shortening. Ghee. Bacon fat. Tropical oils, such as coconut, palm kernel, or palm oil. Seasoning and other foods Salted popcorn and pretzels. Onion salt, garlic salt, seasoned salt, table salt, and sea salt. Worcestershire sauce. Tartar sauce. Barbecue sauce. Teriyaki sauce. Soy sauce, including reduced-sodium. Steak sauce. Canned and packaged gravies. Fish sauce. Oyster sauce. Cocktail sauce. Horseradish that you find on the shelf. Ketchup. Mustard. Meat flavorings and tenderizers. Bouillon cubes. Hot sauce and Tabasco sauce. Premade or packaged marinades. Premade or packaged taco seasonings. Relishes. Regular salad dressings. Where to find more information:  National Heart, Lung, and Blood Institute: www.nhlbi.nih.gov  American Heart Association: www.heart.org Summary  The DASH eating plan is a healthy eating plan that has been shown to reduce high blood pressure (hypertension). It may also reduce your risk for type 2 diabetes, heart disease, and stroke.  With the DASH eating plan, you should limit salt (sodium) intake to 2,300 mg a day. If you have hypertension, you may need to reduce your sodium intake to 1,500 mg a day.  When on the DASH eating plan, aim to eat more fresh fruits and vegetables, whole grains, lean proteins, low-fat dairy, and heart-healthy fats.  Work with your health care provider or diet and nutrition specialist (dietitian) to adjust your eating plan to your   individual calorie needs. This information is not intended to replace advice given to you by your health care provider. Make sure you discuss any questions you have with your health care provider. Document Revised: 10/10/2017 Document Reviewed: 10/21/2016 Elsevier Patient Education  2020 [employer].  Palpitations Palpitations are feelings that your heartbeat is irregular or is faster than normal. It may feel like your heart is  fluttering or skipping a beat. Palpitations are usually not a serious problem. They may be caused by many things, including smoking, caffeine, alcohol, stress, and certain medicines or drugs. Most causes of palpitations are not serious. However, some palpitations can be a sign of a serious problem. You may need further tests to rule out serious medical problems. Follow these instructions at home:     Pay attention to any changes in your condition. Take these actions to help manage your symptoms: Eating and drinking  Avoid foods and drinks that may cause palpitations. These may include: ? Caffeinated coffee, tea, soft drinks, diet pills, and energy drinks. ? Chocolate. ? Alcohol. Lifestyle  Take steps to reduce your stress and anxiety. Things that can help you relax include: ? Yoga. ? Mind-body activities, such as deep breathing, meditation, or using words and images to create positive thoughts (guided imagery). ? Physical activity, such as swimming, jogging, or walking. Tell your health care provider if your palpitations increase with activity. If you have chest pain or shortness of breath with activity, do not continue the activity until you are seen by your health care provider. ? Biofeedback. This is a method that helps you learn to use your mind to control things in your body, such as your heartbeat.  Do not use drugs, including cocaine or ecstasy. Do not use marijuana.  Get plenty of rest and sleep. Keep a regular bed time. General instructions  Take over-the-counter and prescription medicines only as told by your health care provider.  Do not use any products that contain nicotine or tobacco, such as cigarettes and e-cigarettes. If you need help quitting, ask your health care provider.  Keep all follow-up visits as told by your health care provider. This is important. These may include visits for further testing if palpitations do not go away or get worse. Contact a health care  provider if you:  Continue to have a fast or irregular heartbeat after 24 hours.  Notice that your palpitations occur more often. Get help right away if you:  Have chest pain or shortness of breath.  Have a severe headache.  Feel dizzy or you faint. Summary  Palpitations are feelings that your heartbeat is irregular or is faster than normal. It may feel like your heart is fluttering or skipping a beat.  Palpitations may be caused by many things, including smoking, caffeine, alcohol, stress, certain medicines, and drugs.  Although most causes of palpitations are not serious, some causes can be a sign of a serious medical problem.  Get help right away if you faint or have chest pain, shortness of breath, a severe headache, or dizziness. This information is not intended to replace advice given to you by your health care provider. Make sure you discuss any questions you have with your health care provider. Document Revised: 12/10/2017 Document Reviewed: 12/10/2017 Elsevier Patient Education  2020 [employer].

## 2020-04-05 ENCOUNTER — Telehealth: Payer: Self-pay

## 2020-04-05 NOTE — Telephone Encounter (Signed)
Tried to call pt and remind him to go get his labs drawn. Unable to reach pt at this time- no answer and Vm is not set up.  CM

## 2021-03-29 NOTE — Progress Notes (Deleted)
Date:  03/30/2021   Name:  Cody Murray.   DOB:  Oct 27, 1986   MRN:  [id number]   Chief Complaint: No chief complaint on file.  Cody Murray. is a 35 y.o. male who presents today for his Complete Annual Exam. He feels {DESC; WELL/FAIRLY WELL/POORLY:18703}. He reports exercising ***. He reports he is sleeping {DESC; WELL/FAIRLY WELL/POORLY:18703}.    Immunization History  Administered Date(s) Administered  . Influenza,inj,Quad PF,6+ Mos 08/15/2017, 09/21/2018  . Pneumococcal Polysaccharide-23 03/27/2020  . Td 03/11/2004  . Tdap 08/20/2018    Asthma There is no shortness of breath or wheezing. Pertinent negatives include no appetite change, chest pain, headaches, myalgias or trouble swallowing. His symptoms are alleviated by beta-agonist and steroid inhaler. He reports significant improvement on treatment. His past medical history is significant for asthma.    Lab Results  Component Value Date   CREATININE 1.23 08/21/2018   BUN 15 08/21/2018   NA 138 08/21/2018   K 3.5 08/21/2018   CL 104 08/21/2018   CO2 26 08/21/2018   No results found for: CHOL, HDL, LDLCALC, LDLDIRECT, TRIG, CHOLHDL No results found for: TSH No results found for: HGBA1C Lab Results  Component Value Date   WBC 7.1 08/21/2018   HGB 13.9 08/21/2018   HCT 42.7 08/21/2018   MCV 88.8 08/21/2018   PLT 255 08/21/2018   Lab Results  Component Value Date   ALT 53 (H) 08/20/2018   AST 86 (H) 08/20/2018   ALKPHOS 63 08/20/2018   BILITOT 1.1 08/20/2018     Review of Systems  Constitutional: Negative for appetite change, chills, diaphoresis, fatigue and unexpected weight change.  HENT: Negative for hearing loss, tinnitus, trouble swallowing and voice change.   Eyes: Negative for visual disturbance.  Respiratory: Negative for choking, shortness of breath and wheezing.   Cardiovascular: Negative for chest pain, palpitations and leg swelling.  Gastrointestinal: Negative for abdominal  pain, blood in stool, constipation and diarrhea.  Genitourinary: Negative for difficulty urinating, dysuria and frequency.  Musculoskeletal: Negative for arthralgias, back pain and myalgias.  Skin: Negative for color change and rash.  Neurological: Negative for dizziness, syncope and headaches.  Hematological: Negative for adenopathy.  Psychiatric/Behavioral: Negative for dysphoric mood and sleep disturbance.    Patient Active Problem List   Diagnosis Date Noted  . Mild intermittent asthma without complication 01/08/2007    No Known Allergies  Past Surgical History:  Procedure Laterality Date  . OPEN REDUCTION INTERNAL FIXATION (ORIF) HAND Right     Social History   Tobacco Use  . Smoking status: Former Smoker    Types: Cigarettes  . Smokeless tobacco: Never Used  Vaping Use  . Vaping Use: Never used  Substance Use Topics  . Alcohol use: Yes    Comment: occasionally is as specific as he will be  . Drug use: No     Medication list has been reviewed and updated.  No outpatient medications have been marked as taking for the 03/30/21 encounter (Appointment) with Reubin Milan, MD.    [facility] 2/9 Scores 03/27/2020 08/28/2018 08/15/2017 04/03/2016  PHQ - 2 Score 0 0 0 0  PHQ- 9 Score 0 - - -    GAD 7 : Generalized Anxiety Score 03/27/2020  Nervous, Anxious, on Edge 0  Control/stop worrying 0  Worry too much - different things 0  Trouble relaxing 0  Restless 0  Easily annoyed or irritable 0  Afraid - awful might happen 0  Total GAD 7 Score  0  Anxiety Difficulty Not difficult at all    BP Readings from Last 3 Encounters:  03/27/20 136/88  09/21/18 122/78  08/28/18 136/78    Physical Exam Vitals and nursing note reviewed.  Constitutional:      Appearance: Normal appearance. He is well-developed.  HENT:     Head: Normocephalic.     Right Ear: Tympanic membrane, ear canal and external ear normal.     Left Ear: Tympanic membrane, ear canal and external ear  normal.     Nose: Nose normal.  Eyes:     Conjunctiva/sclera: Conjunctivae normal.     Pupils: Pupils are equal, round, and reactive to light.  Neck:     Thyroid: No thyromegaly.     Vascular: No carotid bruit.  Cardiovascular:     Rate and Rhythm: Normal rate and regular rhythm.     Heart sounds: Normal heart sounds.  Pulmonary:     Effort: Pulmonary effort is normal.     Breath sounds: Normal breath sounds. No wheezing.  Chest:  Breasts:     Right: No mass.     Left: No mass.    Abdominal:     General: Bowel sounds are normal.     Palpations: Abdomen is soft.     Tenderness: There is no abdominal tenderness.  Musculoskeletal:        General: Normal range of motion.     Cervical back: Normal range of motion and neck supple.  Lymphadenopathy:     Cervical: No cervical adenopathy.  Skin:    General: Skin is warm and dry.  Neurological:     Mental Status: He is alert and oriented to person, place, and time.     Deep Tendon Reflexes: Reflexes are normal and symmetric.  Psychiatric:        Attention and Perception: Attention normal.        Mood and Affect: Mood normal.        Thought Content: Thought content normal.     Wt Readings from Last 3 Encounters:  03/27/20 235 lb (106.6 kg)  09/21/18 215 lb (97.5 kg)  08/28/18 215 lb (97.5 kg)    There were no vitals taken for this visit.  Assessment and Plan:

## 2021-03-30 ENCOUNTER — Encounter: Payer: Federal, State, Local not specified - PPO | Admitting: Internal Medicine

## 2021-03-30 DIAGNOSIS — Z1159 Encounter for screening for other viral diseases: Secondary | ICD-10-CM

## 2021-03-30 DIAGNOSIS — Z0000 Encounter for general adult medical examination without abnormal findings: Secondary | ICD-10-CM

## 2021-03-30 DIAGNOSIS — J4520 Mild intermittent asthma, uncomplicated: Secondary | ICD-10-CM

## 2021-06-11 ENCOUNTER — Other Ambulatory Visit: Payer: Self-pay | Admitting: Internal Medicine

## 2021-06-11 DIAGNOSIS — J4520 Mild intermittent asthma, uncomplicated: Secondary | ICD-10-CM

## 2021-06-11 NOTE — Telephone Encounter (Signed)
Requested medication (s) are due for refill today: Yes  Requested medication (s) are on the active medication list: Yes  Last refill:  03/27/20  Future visit scheduled: No  Notes to clinic:  Prescription expired.    Requested Prescriptions  Pending Prescriptions Disp Refills   WIXELA INHUB 250-50 MCG/ACT AEPB [Pharmacy Med Name: WIXELA 250-50 INHUB] 180 each 4    Sig: TAKE 1 PUFF BY MOUTH TWICE A DAY      Pulmonology:  Combination Products Failed - 06/11/2021  1:40 AM      Failed - Valid encounter within last 12 months    Recent Outpatient Visits           1 year ago Annual physical exam   [facility] Reubin Milan, MD   2 years ago Multiple rib fractures involving four or more ribs   [facility] Reubin Milan, MD   2 years ago Multiple rib fractures involving four or more ribs   [facility] Reubin Milan, MD   3 years ago Mild intermittent asthma without complication   [facility] Reubin Milan, MD   4 years ago Pharyngitis, unspecified etiology   [facility] Reubin Milan, MD

## 2022-06-04 ENCOUNTER — Other Ambulatory Visit: Payer: Self-pay | Admitting: Internal Medicine

## 2022-06-04 DIAGNOSIS — J4520 Mild intermittent asthma, uncomplicated: Secondary | ICD-10-CM

## 2022-06-05 NOTE — Telephone Encounter (Signed)
Requested medication (s) are due for refill today: yes  Requested medication (s) are on the active medication list: yes  Last refill:  06/11/21 #60/3  Future visit scheduled: no  Notes to clinic:  Unable to refill per protocol, appointment needed. Pt called, unable to leave VM     Requested Prescriptions  Pending Prescriptions Disp Refills   WIXELA INHUB 250-50 MCG/ACT AEPB [Pharmacy Med Name: WIXELA 250-50 INHUB] 60 each 3    Sig: INHALE 1 PUFF BY MOUTH TWICE A DAY     Pulmonology:  Combination Products Failed - 06/04/2022  2:16 AM      Failed - Valid encounter within last 12 months    Recent Outpatient Visits           2 years ago Annual physical exam   [facility] Reubin Milan, MD   3 years ago Multiple rib fractures involving four or more ribs   [facility] Reubin Milan, MD   3 years ago Multiple rib fractures involving four or more ribs   [facility] Reubin Milan, MD   4 years ago Mild intermittent asthma without complication   [facility] Reubin Milan, MD   5 years ago Pharyngitis, unspecified etiology   [facility] Reubin Milan, MD

## 2022-06-05 NOTE — Telephone Encounter (Signed)
Pt called on both numbers in chart. Neither have option to leave VM. Pt is due for an appt for medication refill.
